# Patient Record
Sex: Female | Born: 1940 | Race: White | Hispanic: No | Marital: Married | Smoking: Former smoker
Health system: Southern US, Community
[De-identification: ages and names within clinical notes are randomized; demographics above are authoritative.]

## PROBLEM LIST (undated history)

## (undated) DIAGNOSIS — I499 Cardiac arrhythmia, unspecified: Secondary | ICD-10-CM

## (undated) DIAGNOSIS — K219 Gastro-esophageal reflux disease without esophagitis: Secondary | ICD-10-CM

## (undated) DIAGNOSIS — K589 Irritable bowel syndrome without diarrhea: Secondary | ICD-10-CM

## (undated) DIAGNOSIS — R112 Nausea with vomiting, unspecified: Secondary | ICD-10-CM

## (undated) DIAGNOSIS — F419 Anxiety disorder, unspecified: Secondary | ICD-10-CM

## (undated) DIAGNOSIS — G7000 Myasthenia gravis without (acute) exacerbation: Secondary | ICD-10-CM

## (undated) DIAGNOSIS — M1990 Unspecified osteoarthritis, unspecified site: Secondary | ICD-10-CM

## (undated) DIAGNOSIS — R197 Diarrhea, unspecified: Secondary | ICD-10-CM

## (undated) DIAGNOSIS — D649 Anemia, unspecified: Secondary | ICD-10-CM

## (undated) DIAGNOSIS — E039 Hypothyroidism, unspecified: Secondary | ICD-10-CM

## (undated) DIAGNOSIS — Z9889 Other specified postprocedural states: Secondary | ICD-10-CM

## (undated) HISTORY — PX: TONSILLECTOMY: SUR1361

## (undated) HISTORY — PX: OTHER SURGICAL HISTORY: SHX169

---

## 1961-12-11 HISTORY — PX: OTHER SURGICAL HISTORY: SHX169

## 1969-08-11 HISTORY — PX: ABDOMINAL HYSTERECTOMY: SHX81

## 1998-03-19 ENCOUNTER — Ambulatory Visit (HOSPITAL_BASED_OUTPATIENT_CLINIC_OR_DEPARTMENT_OTHER): Admission: RE | Admit: 1998-03-19 | Discharge: 1998-03-19 | Payer: Self-pay | Admitting: Orthopedic Surgery

## 1999-12-15 ENCOUNTER — Encounter: Admission: RE | Admit: 1999-12-15 | Discharge: 1999-12-15 | Payer: Self-pay | Admitting: Gastroenterology

## 1999-12-15 ENCOUNTER — Encounter: Payer: Self-pay | Admitting: Gastroenterology

## 2001-02-11 ENCOUNTER — Encounter: Admission: RE | Admit: 2001-02-11 | Discharge: 2001-02-11 | Payer: Self-pay | Admitting: Gastroenterology

## 2001-02-11 ENCOUNTER — Encounter: Payer: Self-pay | Admitting: Gastroenterology

## 2001-03-20 ENCOUNTER — Encounter: Admission: RE | Admit: 2001-03-20 | Discharge: 2001-03-20 | Payer: Self-pay | Admitting: Gastroenterology

## 2001-03-20 ENCOUNTER — Encounter: Payer: Self-pay | Admitting: Gastroenterology

## 2001-06-27 ENCOUNTER — Ambulatory Visit (HOSPITAL_BASED_OUTPATIENT_CLINIC_OR_DEPARTMENT_OTHER): Admission: RE | Admit: 2001-06-27 | Discharge: 2001-06-27 | Payer: Self-pay | Admitting: Orthopedic Surgery

## 2002-04-15 ENCOUNTER — Encounter: Admission: RE | Admit: 2002-04-15 | Discharge: 2002-04-15 | Payer: Self-pay | Admitting: Gastroenterology

## 2002-04-15 ENCOUNTER — Encounter: Payer: Self-pay | Admitting: Gastroenterology

## 2003-04-21 ENCOUNTER — Encounter: Admission: RE | Admit: 2003-04-21 | Discharge: 2003-04-21 | Payer: Self-pay | Admitting: Gastroenterology

## 2003-04-21 ENCOUNTER — Encounter: Payer: Self-pay | Admitting: Gastroenterology

## 2003-10-15 ENCOUNTER — Encounter: Admission: RE | Admit: 2003-10-15 | Discharge: 2003-10-15 | Payer: Self-pay | Admitting: Gastroenterology

## 2004-02-19 ENCOUNTER — Ambulatory Visit (HOSPITAL_COMMUNITY): Admission: RE | Admit: 2004-02-19 | Discharge: 2004-02-19 | Payer: Self-pay | Admitting: Gastroenterology

## 2004-05-20 ENCOUNTER — Encounter: Admission: RE | Admit: 2004-05-20 | Discharge: 2004-05-20 | Payer: Self-pay | Admitting: Gastroenterology

## 2005-05-31 ENCOUNTER — Encounter: Admission: RE | Admit: 2005-05-31 | Discharge: 2005-05-31 | Payer: Self-pay | Admitting: Gastroenterology

## 2006-06-01 ENCOUNTER — Encounter: Admission: RE | Admit: 2006-06-01 | Discharge: 2006-06-01 | Payer: Self-pay | Admitting: Gastroenterology

## 2006-06-14 ENCOUNTER — Encounter: Admission: RE | Admit: 2006-06-14 | Discharge: 2006-06-14 | Payer: Self-pay | Admitting: Gastroenterology

## 2007-06-04 ENCOUNTER — Encounter: Admission: RE | Admit: 2007-06-04 | Discharge: 2007-06-04 | Payer: Self-pay | Admitting: Gastroenterology

## 2008-06-04 ENCOUNTER — Encounter: Admission: RE | Admit: 2008-06-04 | Discharge: 2008-06-04 | Payer: Self-pay | Admitting: Gastroenterology

## 2009-06-15 ENCOUNTER — Encounter: Admission: RE | Admit: 2009-06-15 | Discharge: 2009-06-15 | Payer: Self-pay | Admitting: Gastroenterology

## 2009-11-30 ENCOUNTER — Other Ambulatory Visit: Admission: RE | Admit: 2009-11-30 | Discharge: 2009-11-30 | Payer: Self-pay | Admitting: Obstetrics and Gynecology

## 2009-12-28 ENCOUNTER — Ambulatory Visit (HOSPITAL_BASED_OUTPATIENT_CLINIC_OR_DEPARTMENT_OTHER): Admission: RE | Admit: 2009-12-28 | Discharge: 2009-12-28 | Payer: Self-pay | Admitting: Orthopedic Surgery

## 2010-03-01 ENCOUNTER — Encounter: Admission: RE | Admit: 2010-03-01 | Discharge: 2010-03-01 | Payer: Self-pay | Admitting: Internal Medicine

## 2010-07-05 ENCOUNTER — Encounter: Admission: RE | Admit: 2010-07-05 | Discharge: 2010-07-05 | Payer: Self-pay | Admitting: Internal Medicine

## 2011-02-26 LAB — BASIC METABOLIC PANEL
BUN: 10 mg/dL (ref 6–23)
CO2: 26 mEq/L (ref 19–32)
Calcium: 9.5 mg/dL (ref 8.4–10.5)
Chloride: 105 mEq/L (ref 96–112)
Creatinine, Ser: 0.79 mg/dL (ref 0.4–1.2)
GFR calc Af Amer: >60 mL/min (ref >60)
GFR calc non Af Amer: >60 mL/min (ref >60)
Glucose, Bld: 96 mg/dL (ref 70–99)
Potassium: 4.6 mEq/L (ref 3.5–5.1)
Sodium: 138 mEq/L (ref 135–145)

## 2011-02-26 LAB — POCT HEMOGLOBIN-HEMACUE: Hemoglobin: 12.5 g/dL (ref 12.0–15.0)

## 2011-04-28 NOTE — Op Note (Signed)
[facility]  Patient:    Jean Jennings, Jean Jennings                         MRN: [id number] Proc. Date: 06/27/01 Adm. Date:  [id number] Attending:  Ronne Binning                           Operative Report  PREOPERATIVE DIAGNOSIS:  Partial scapholunate ligament tear, triangular fibrocartilage complex tear - left wrist.  POSTOPERATIVE DIAGNOSIS:  Partial scapholunate ligament tear, triangular fibrocartilage complex tear - left wrist.  OPERATION:  Arthroscopy with debridement, capsular shrinkage of scapholunate TFCC tear - left wrist.  SURGEON:  Nicki Reaper, M.D.  ASSISTANT:  Joaquin Courts, R.N.  ANESTHESIA:  Axillary block.  DATE OF OPERATION:  June 27, 2001  ANESTHESIOLOGIST:  Janetta Hora. Gelene Mink, M.D.  HISTORY:  The patient is a 70 year old female with a history of wrist pain. She had an MRI with a collection of contrast on the ulnar aspect of her wrist and communication through the scapholunate ligament complex.  PROCEDURE:  The patient was brought to the operating room where an axillary block was carried out without difficulty.  She was prepped and draped using Betadine scrub and solution with the left arm free.  The limb was placed in the arthroscopy tower with 10 pounds of traction applied, the joint inflated to the 3/4 portal.  A transverse incision was made through skin only, deepened with the hemostat.  A blunt trocar was used to enter the joint.  The joint was inspected.  The volar radial wrist ligaments were intact.  The lunar triquetral ligament was intact.  The scapholunate showed a tear in the most proximal aspect.  The cartilage showed no degenerative changes on the scaphoid lunate triquetrum, distal radius.  The triangular fibrocartilage showed some fibrillation.  A small partial tear at the margin of the prestyloid recess was identified.  An irrigation catheter was placed in 6U.  A 4/5 portal opened after localization with a 22  gauge needle.  The joint was then inspected from the ulnar side 4/5 portal after creating this similar to the 3/4.  Lunar triquetral ligament was intact.  Capsular shrinkage to the TFCC dorsal attachment and debridement of the synovitis on the ulnar side was then performed, stabilizing the TFCC with a normal trampoline.  The scope was transferred from 3/4 to 4/5 over adjustment of the arthroscopy tower.  The scapholunate ligament was probed and found to be a partial tear.  The dorsal portion was intact.  The mid carpal joint was then inspected.  A probe was not able to be passed through the scapholunate and there was no step-off.  The remainder of the joint showed no cartilaginous damage on triquetrum hamate capitate or STT joint.  The partial scapholunate ligament was then debrided with a shaver.  A shrinkage to the dorsal component of the scapholunate ligament complex was then performed.  The mid carpal joint was then reinspected and excellent stability was afforded.  The instruments were removed, the portals closed with interrupted 5-0 nylon sutures, sterile compressive dressing, dorsal palmar and thumb spica splint applied.  The patient tolerated the procedure well and was taken to the recovery room for observation in satisfactory condition.  DISPOSITION:  She is discharged home to return to [facility] of [place] in one week on Talwin NX and Keflex. DD:  06/27/01 TD:  06/27/01 Job: [id number]

## 2011-07-17 ENCOUNTER — Other Ambulatory Visit: Payer: Self-pay | Admitting: Internal Medicine

## 2011-07-17 DIAGNOSIS — Z1231 Encounter for screening mammogram for malignant neoplasm of breast: Secondary | ICD-10-CM

## 2011-08-02 ENCOUNTER — Ambulatory Visit
Admission: RE | Admit: 2011-08-02 | Discharge: 2011-08-02 | Disposition: A | Discharge status: Home / Self Care | Payer: Federal, State, Local not specified - PPO | Source: Ambulatory Visit | Attending: Internal Medicine | Admitting: Internal Medicine

## 2011-08-02 DIAGNOSIS — Z1231 Encounter for screening mammogram for malignant neoplasm of breast: Secondary | ICD-10-CM

## 2011-09-11 HISTORY — PX: OTHER SURGICAL HISTORY: SHX169

## 2011-09-22 ENCOUNTER — Ambulatory Visit (HOSPITAL_BASED_OUTPATIENT_CLINIC_OR_DEPARTMENT_OTHER)
Admission: RE | Admit: 2011-09-22 | Discharge: 2011-09-22 | Disposition: A | Discharge status: Home / Self Care | Payer: Federal, State, Local not specified - PPO | Source: Ambulatory Visit | Attending: Orthopedic Surgery | Admitting: Orthopedic Surgery

## 2011-09-22 DIAGNOSIS — E039 Hypothyroidism, unspecified: Secondary | ICD-10-CM | POA: Insufficient documentation

## 2011-09-22 DIAGNOSIS — I498 Other specified cardiac arrhythmias: Secondary | ICD-10-CM | POA: Insufficient documentation

## 2011-09-22 DIAGNOSIS — M1710 Unilateral primary osteoarthritis, unspecified knee: Secondary | ICD-10-CM | POA: Insufficient documentation

## 2011-09-22 DIAGNOSIS — I4949 Other premature depolarization: Secondary | ICD-10-CM | POA: Insufficient documentation

## 2011-09-22 DIAGNOSIS — IMO0002 Reserved for concepts with insufficient information to code with codable children: Secondary | ICD-10-CM | POA: Insufficient documentation

## 2011-09-22 DIAGNOSIS — Z79899 Other long term (current) drug therapy: Secondary | ICD-10-CM | POA: Insufficient documentation

## 2011-09-22 DIAGNOSIS — X58XXXA Exposure to other specified factors, initial encounter: Secondary | ICD-10-CM | POA: Insufficient documentation

## 2011-09-22 LAB — POCT HEMOGLOBIN-HEMACUE: Hemoglobin: 12.9 g/dL (ref 12.0–15.0)

## 2011-10-04 NOTE — Op Note (Signed)
NAMECRISTINE, Jean Jennings                  ACCOUNT NO.:  [id number]  MEDICAL RECORD NO.:  [id number]  LOCATION:                               FACILITY:  [facility]  PHYSICIAN:  Ollen Gross, M.D.    DATE OF BIRTH:  [birth year]  DATE OF PROCEDURE:  09/22/2011 DATE OF DISCHARGE:                              OPERATIVE REPORT   PREOPERATIVE DIAGNOSES: 1. Left knee medial meniscal tear. 2. Right knee arthritis.  POSTOPERATIVE DIAGNOSES: 1. Left knee medial meniscal tear. 2. Right knee arthritis.  PROCEDURE: 1. Left knee arthroscopy with medial meniscal debridement. 2. Right knee cortisone injection.  SURGEON:  Ollen Gross, M.D.  ASSISTANT:  No assistant.  ANESTHESIA:  General.  ESTIMATED BLOOD LOSS:  Minimal.  DRAINS:  None.  COMPLICATION:  None.  CONDITION:  Stable to recovery.  CLINICAL NOTE:  Ms. Jean Jennings is a 70 year old female who has a several- month history of significant left knee pain and mechanical symptoms. Exam and history suggested a medial meniscal tear, confirmed by MRI.  In addition in the past few weeks, she has had pain and swelling in her right knee.  Symptoms at the right knee are not mechanical, but are more inflammatory.  In order to help improve her rehab experience, we decided to do cortisone injection in the right knee today.  PROCEDURE IN DETAIL:  After successful administration of general anesthetic, a tourniquet was placed high on the left thigh and left lower extremity was prepped and draped in the usual sterile fashion. Standard superomedial and inferolateral incisions were made, inflow cannula passed, superomedial camera passed inferolateral.  Arthroscopic visualization proceeds.  The undersurface of the patella and trochlea showed some chondromalacia, but no full-thickness chondral defects.  The medial and lateral gutters were visualized.  There are couple of small loose cartilage fragments and those were flushed out with the arthroscopic pump.   The flexion valgus force applied to the knee and the medial compartment was entered.  There was evidence of a tear in the body and the posterior horn of the medial meniscus.  It was an unstable tear.  There was also some grade 2 chondromalacia on the medial femoral condyle, but no full-thickness chondral defects.  Spinal needle was used to localize the inferomedial portal, small incision made and dilator placed.  The meniscus was debrided back to stable base with baskets and a 4.2 mm shaver and then sealed off with the ArthroCare device.  The medial femoral condyle was probed and there was no unstable cartilage in the areas where the chondromalacia was present.  The intercondylar notch was visualized.  The ACL was normal.  Lateral compartment was entered, it looks normal.  Joints were again inspected.  No other tears, defects, or loose bodies noted.  There was a plica present over the medial side superiorly and I excised that with the ArthroCare.  The arthroscopic equipment was then removed from the inferior portals, which were closed with interrupted 4-0 nylon.  20 mL of 0.25% Marcaine with epi injected through the inflow cannula, and that was removed and that portal closed with nylon.  Incisions cleaned and dried and a bulky sterile dressing applied.  I  then prepped the right knee and injected with combination 5 mL 1% lidocaine, 4 mg of Decadron.  She was subsequently awakened and transported to recovery in stable condition.     Ollen Gross, M.D.     FA/MEDQ  D:  09/22/2011  T:  09/22/2011  Job:  [id number]  Electronically Signed by Ollen Gross M.D. on 10/04/2011 11:25:20 AM

## 2012-05-22 ENCOUNTER — Other Ambulatory Visit: Payer: Self-pay | Admitting: Dermatology

## 2012-08-13 ENCOUNTER — Other Ambulatory Visit: Payer: Self-pay | Admitting: Internal Medicine

## 2012-08-13 DIAGNOSIS — Z1231 Encounter for screening mammogram for malignant neoplasm of breast: Secondary | ICD-10-CM

## 2012-08-27 ENCOUNTER — Ambulatory Visit: Payer: Federal, State, Local not specified - PPO

## 2012-08-29 ENCOUNTER — Ambulatory Visit
Admission: RE | Admit: 2012-08-29 | Discharge: 2012-08-29 | Disposition: A | Discharge status: Home / Self Care | Payer: Federal, State, Local not specified - PPO | Source: Ambulatory Visit | Attending: Internal Medicine | Admitting: Internal Medicine

## 2012-08-29 DIAGNOSIS — Z1231 Encounter for screening mammogram for malignant neoplasm of breast: Secondary | ICD-10-CM

## 2013-05-07 ENCOUNTER — Encounter (HOSPITAL_COMMUNITY): Payer: Self-pay | Admitting: *Deleted

## 2013-05-07 ENCOUNTER — Other Ambulatory Visit: Payer: Self-pay | Admitting: Gastroenterology

## 2013-05-12 ENCOUNTER — Encounter (HOSPITAL_COMMUNITY): Payer: Self-pay | Admitting: Pharmacy Technician

## 2013-05-20 ENCOUNTER — Ambulatory Visit (HOSPITAL_COMMUNITY)
Admission: RE | Admit: 2013-05-20 | Payer: Federal, State, Local not specified - PPO | Source: Ambulatory Visit | Admitting: Gastroenterology

## 2013-05-20 HISTORY — DX: Diarrhea, unspecified: R19.7

## 2013-05-20 HISTORY — DX: Cardiac arrhythmia, unspecified: I49.9

## 2013-05-20 HISTORY — DX: Hypothyroidism, unspecified: E03.9

## 2013-05-20 HISTORY — DX: Gastro-esophageal reflux disease without esophagitis: K21.9

## 2013-05-20 SURGERY — COLONOSCOPY WITH PROPOFOL
Anesthesia: Monitor Anesthesia Care

## 2013-05-22 ENCOUNTER — Other Ambulatory Visit: Payer: Self-pay | Admitting: Gastroenterology

## 2013-05-23 ENCOUNTER — Encounter (HOSPITAL_COMMUNITY): Payer: Self-pay | Admitting: Pharmacy Technician

## 2013-05-23 ENCOUNTER — Encounter (HOSPITAL_COMMUNITY): Payer: Self-pay | Admitting: *Deleted

## 2013-05-26 ENCOUNTER — Other Ambulatory Visit: Payer: Self-pay | Admitting: Dermatology

## 2013-06-03 ENCOUNTER — Encounter (HOSPITAL_COMMUNITY): Payer: Self-pay

## 2013-06-03 ENCOUNTER — Ambulatory Visit (HOSPITAL_COMMUNITY): Payer: Federal, State, Local not specified - PPO | Admitting: Anesthesiology

## 2013-06-03 ENCOUNTER — Encounter (HOSPITAL_COMMUNITY)
Admission: RE | Disposition: A | Discharge status: Home / Self Care | Payer: Self-pay | Source: Ambulatory Visit | Attending: Gastroenterology

## 2013-06-03 ENCOUNTER — Encounter (HOSPITAL_COMMUNITY): Payer: Self-pay | Admitting: Anesthesiology

## 2013-06-03 ENCOUNTER — Ambulatory Visit (HOSPITAL_COMMUNITY)
Admission: RE | Admit: 2013-06-03 | Discharge: 2013-06-03 | Disposition: A | Discharge status: Home / Self Care | Payer: Federal, State, Local not specified - PPO | Source: Ambulatory Visit | Attending: Gastroenterology | Admitting: Gastroenterology

## 2013-06-03 DIAGNOSIS — K5730 Diverticulosis of large intestine without perforation or abscess without bleeding: Secondary | ICD-10-CM | POA: Insufficient documentation

## 2013-06-03 DIAGNOSIS — E039 Hypothyroidism, unspecified: Secondary | ICD-10-CM | POA: Insufficient documentation

## 2013-06-03 DIAGNOSIS — R197 Diarrhea, unspecified: Secondary | ICD-10-CM | POA: Insufficient documentation

## 2013-06-03 HISTORY — PX: COLONOSCOPY WITH PROPOFOL: SHX5780

## 2013-06-03 SURGERY — COLONOSCOPY WITH PROPOFOL
Anesthesia: Monitor Anesthesia Care

## 2013-06-03 MED ORDER — PROMETHAZINE HCL 25 MG/ML IJ SOLN: 6.2500 mg | INTRAMUSCULAR | Status: DC | PRN

## 2013-06-03 MED ORDER — SODIUM CHLORIDE 0.9 % IV SOLN: INTRAVENOUS | Status: DC

## 2013-06-03 MED ORDER — LACTATED RINGERS IV SOLN
INTRAVENOUS | Status: DC
  Administered 2013-06-03: 1000 mL via INTRAVENOUS

## 2013-06-03 MED ORDER — LACTATED RINGERS IV SOLN
INTRAVENOUS | Status: DC | PRN
  Administered 2013-06-03: 11:00:00 via INTRAVENOUS

## 2013-06-03 MED ORDER — KETAMINE HCL 50 MG/ML IJ SOLN
INTRAMUSCULAR | Status: DC | PRN
  Administered 2013-06-03: 15 mg via INTRAMUSCULAR
  Administered 2013-06-03: 10 mg via INTRAMUSCULAR

## 2013-06-03 MED ORDER — PROPOFOL INFUSION 10 MG/ML OPTIME
INTRAVENOUS | Status: DC | PRN
  Administered 2013-06-03: 75 ug/kg/min via INTRAVENOUS

## 2013-06-03 MED ORDER — LIDOCAINE HCL (CARDIAC) 20 MG/ML IV SOLN
INTRAVENOUS | Status: DC | PRN
  Administered 2013-06-03: 50 mg via INTRAVENOUS

## 2013-06-03 SURGICAL SUPPLY — 21 items

## 2013-06-03 NOTE — Op Note (Signed)
Problem: Unexplained chronic diarrhea  Procedure: Diagnostic colonoscopy  Endoscopist: Danise Edge  Premedication: Propofol administered by anesthesia  Procedure: The patient was placed in the left lateral decubitus position. Anal inspection and digital rectal exam were normal. The Pentax pediatric colonoscope was introduced into the rectum and advanced to the cecum. A normal-appearing ileocecal valve and appendiceal orifice were identified. Colonic preparation for the exam today was good.  Rectum. Normal. Retroflexed view of the distal rectum normal.  Sigmoid colon and descending colon. Left colonic diverticulosis.  Splenic flexure. Normal.  Transverse colon. Normal.  Hepatic flexure. Normal.  Ascending colon. Normal.  Cecum and ileocecal valve. Normal.  Biopsies: Random colon biopsies were performed from the right colon and left colon to look for microscopic colitis.  Assessment: Normal screening proctocolonoscopy to the cecum except for left colonic diverticulosis. Random colon biopsies to rule out microscopic colitis pending.

## 2013-06-03 NOTE — Transfer of Care (Signed)
Immediate Anesthesia Transfer of Care Note  Patient: Jean Jennings  Procedure(s) Performed: Procedure(s) (LRB): COLONOSCOPY WITH PROPOFOL (N/A)  Patient Location: PACU  Anesthesia Type: MAC  Level of Consciousness: sedated, patient cooperative and responds to stimulaton  Airway & Oxygen Therapy: Patient Spontanous Breathing and Patient connected to face mask oxgen  Post-op Assessment: Report given to PACU RN and Post -op Vital signs reviewed and stable  Post vital signs: Reviewed and stable  Complications: No apparent anesthesia complications

## 2013-06-03 NOTE — Anesthesia Postprocedure Evaluation (Signed)
  Anesthesia Post-op Note  Patient: Jean Jennings  Procedure(s) Performed: Procedure(s) (LRB): COLONOSCOPY WITH PROPOFOL (N/A)  Patient Location: PACU  Anesthesia Type: MAC  Level of Consciousness: awake and alert   Airway and Oxygen Therapy: Patient Spontanous Breathing  Post-op Pain: mild  Post-op Assessment: Post-op Vital signs reviewed, Patient's Cardiovascular Status Stable, Respiratory Function Stable, Patent Airway and No signs of Nausea or vomiting  Last Vitals:  Filed Vitals:   06/03/13 1133  BP: 130/56  Temp: 36.5 C  Resp: 14    Post-op Vital Signs: stable   Complications: No apparent anesthesia complications

## 2013-06-03 NOTE — Anesthesia Preprocedure Evaluation (Signed)
Anesthesia Evaluation  Patient identified by MRN, date of birth, ID band Patient awake    Reviewed: Allergy & Precautions, H&P , NPO status , Patient's Chart, lab work & pertinent test results  Airway Mallampati: II TM Distance: >3 FB Neck ROM: Full    Dental no notable dental hx.    Pulmonary neg pulmonary ROS,  breath sounds clear to auscultation  Pulmonary exam normal       Cardiovascular negative cardio ROS  + dysrhythmias Supra Ventricular Tachycardia Rhythm:Regular Rate:Normal     Neuro/Psych negative neurological ROS  negative psych ROS   GI/Hepatic negative GI ROS, Neg liver ROS,   Endo/Other  Hypothyroidism   Renal/GU negative Renal ROS  negative genitourinary   Musculoskeletal negative musculoskeletal ROS (+)   Abdominal   Peds negative pediatric ROS (+)  Hematology negative hematology ROS (+)   Anesthesia Other Findings   Reproductive/Obstetrics negative OB ROS                           Anesthesia Physical Anesthesia Plan  ASA: II  Anesthesia Plan: MAC   Post-op Pain Management:    Induction: Intravenous  Airway Management Planned: Nasal Cannula  Additional Equipment:   Intra-op Plan:   Post-operative Plan:   Informed Consent: I have reviewed the patients History and Physical, chart, labs and discussed the procedure including the risks, benefits and alternatives for the proposed anesthesia with the patient or authorized representative who has indicated his/her understanding and acceptance.     Plan Discussed with: CRNA and Surgeon  Anesthesia Plan Comments:         Anesthesia Quick Evaluation

## 2013-06-03 NOTE — H&P (Signed)
  Problem: Chronic diarrhea. Normal screening colonoscopy on 02/19/2004  History: The patient is a 72 year old female born 07/03/1941. The patient underwent a normal screening colonoscopy on 02/19/2004.  Patient has a remote history of diarrhea predominant irritable bowel syndrome.  The patient has developed more frequent bouts of nonbloody diarrhea unassociated with fever, weight loss, gastrointestinal bleeding. The patient denies a personal or family history of celiac disease. She consumes soy milk. There is no past history of inflammatory bowel disease.  Differential diagnosis includes the following: Diarrhea predominant irritable bowel syndrome, inflammatory bowel disease including microscopic colitis, and carbohydrate intolerance. Tissue transglutaminase antibody was negative as a screen for celiac disease.  Patient is scheduled to undergo a diagnostic colonoscopy with performance of random colon biopsies to rule out microscopic colitis.  Chronic medications: Metoprolol. Premarin. Synthroid. Citalopram.  Past medical and surgical history: Depression. Urinary urgency. Irritable bowel syndrome. Primary hypothyroidism. Palpitations due to unifocal PVCs. Resting tremor. Gastroesophageal reflux. Bilateral hearing aids. Hypercholesterolemia. Tonsillectomy. Appendectomy. Hysterectomy with bilateral salpingo-oophorectomy. History of endometriosis. Thyroidectomy. Knee arthroscopy.  Medication allergies: Codeine. Fosamax. Celebrex.  Exam: The patient is alert and lying comfortably on the endoscopy stretcher. Abdomen is soft, flat, and nontender to palpation. Cardiac exam reveals a regular rhythm. Lungs are clear to auscultation  Plan: Proceed with diagnostic colonoscopy with random colon biopsies to rule out microscopic colitis.

## 2013-06-04 ENCOUNTER — Encounter (HOSPITAL_COMMUNITY): Payer: Self-pay | Admitting: Gastroenterology

## 2013-10-01 ENCOUNTER — Other Ambulatory Visit: Payer: Self-pay

## 2013-10-01 DIAGNOSIS — Z1231 Encounter for screening mammogram for malignant neoplasm of breast: Secondary | ICD-10-CM

## 2013-10-21 ENCOUNTER — Ambulatory Visit
Admission: RE | Admit: 2013-10-21 | Discharge: 2013-10-21 | Disposition: A | Discharge status: Home / Self Care | Payer: Federal, State, Local not specified - PPO | Source: Ambulatory Visit

## 2013-10-21 DIAGNOSIS — Z1231 Encounter for screening mammogram for malignant neoplasm of breast: Secondary | ICD-10-CM

## 2013-10-23 ENCOUNTER — Other Ambulatory Visit: Payer: Self-pay | Admitting: Internal Medicine

## 2013-10-23 DIAGNOSIS — R928 Other abnormal and inconclusive findings on diagnostic imaging of breast: Secondary | ICD-10-CM

## 2013-11-04 ENCOUNTER — Ambulatory Visit
Admission: RE | Admit: 2013-11-04 | Discharge: 2013-11-04 | Disposition: A | Discharge status: Home / Self Care | Payer: Federal, State, Local not specified - PPO | Source: Ambulatory Visit | Attending: Internal Medicine | Admitting: Internal Medicine

## 2013-11-04 DIAGNOSIS — R928 Other abnormal and inconclusive findings on diagnostic imaging of breast: Secondary | ICD-10-CM

## 2014-12-22 ENCOUNTER — Other Ambulatory Visit: Payer: Self-pay

## 2014-12-22 DIAGNOSIS — Z1231 Encounter for screening mammogram for malignant neoplasm of breast: Secondary | ICD-10-CM

## 2014-12-29 ENCOUNTER — Ambulatory Visit
Admission: RE | Admit: 2014-12-29 | Discharge: 2014-12-29 | Disposition: A | Discharge status: Home / Self Care | Payer: Federal, State, Local not specified - PPO | Source: Ambulatory Visit

## 2014-12-29 DIAGNOSIS — Z1231 Encounter for screening mammogram for malignant neoplasm of breast: Secondary | ICD-10-CM

## 2014-12-30 ENCOUNTER — Other Ambulatory Visit: Payer: Self-pay | Admitting: Internal Medicine

## 2014-12-30 DIAGNOSIS — R928 Other abnormal and inconclusive findings on diagnostic imaging of breast: Secondary | ICD-10-CM

## 2015-01-07 ENCOUNTER — Other Ambulatory Visit: Payer: Federal, State, Local not specified - PPO

## 2015-01-13 ENCOUNTER — Other Ambulatory Visit: Payer: Self-pay | Admitting: Internal Medicine

## 2015-01-13 ENCOUNTER — Ambulatory Visit
Admission: RE | Admit: 2015-01-13 | Discharge: 2015-01-13 | Disposition: A | Discharge status: Home / Self Care | Payer: Federal, State, Local not specified - PPO | Source: Ambulatory Visit | Attending: Internal Medicine | Admitting: Internal Medicine

## 2015-01-13 DIAGNOSIS — R928 Other abnormal and inconclusive findings on diagnostic imaging of breast: Secondary | ICD-10-CM

## 2015-01-27 ENCOUNTER — Other Ambulatory Visit: Payer: Self-pay | Admitting: Internal Medicine

## 2015-01-27 DIAGNOSIS — R1013 Epigastric pain: Secondary | ICD-10-CM

## 2015-01-29 ENCOUNTER — Ambulatory Visit
Admission: RE | Admit: 2015-01-29 | Discharge: 2015-01-29 | Disposition: A | Discharge status: Home / Self Care | Payer: Federal, State, Local not specified - PPO | Source: Ambulatory Visit | Attending: Internal Medicine | Admitting: Internal Medicine

## 2015-01-29 DIAGNOSIS — R1013 Epigastric pain: Secondary | ICD-10-CM

## 2015-06-02 ENCOUNTER — Emergency Department (HOSPITAL_COMMUNITY): Payer: Federal, State, Local not specified - PPO

## 2015-06-02 ENCOUNTER — Emergency Department (HOSPITAL_COMMUNITY)
Admission: EM | Admit: 2015-06-02 | Discharge: 2015-06-02 | Disposition: A | Discharge status: Home / Self Care | Payer: Federal, State, Local not specified - PPO | Attending: Emergency Medicine | Admitting: Emergency Medicine

## 2015-06-02 ENCOUNTER — Encounter (HOSPITAL_COMMUNITY): Payer: Self-pay | Admitting: Emergency Medicine

## 2015-06-02 DIAGNOSIS — Z79899 Other long term (current) drug therapy: Secondary | ICD-10-CM | POA: Insufficient documentation

## 2015-06-02 DIAGNOSIS — E039 Hypothyroidism, unspecified: Secondary | ICD-10-CM | POA: Diagnosis not present

## 2015-06-02 DIAGNOSIS — F329 Major depressive disorder, single episode, unspecified: Secondary | ICD-10-CM | POA: Insufficient documentation

## 2015-06-02 DIAGNOSIS — Z8719 Personal history of other diseases of the digestive system: Secondary | ICD-10-CM | POA: Diagnosis not present

## 2015-06-02 DIAGNOSIS — M549 Dorsalgia, unspecified: Secondary | ICD-10-CM

## 2015-06-02 DIAGNOSIS — M5416 Radiculopathy, lumbar region: Secondary | ICD-10-CM | POA: Diagnosis not present

## 2015-06-02 DIAGNOSIS — Z87891 Personal history of nicotine dependence: Secondary | ICD-10-CM | POA: Diagnosis not present

## 2015-06-02 DIAGNOSIS — R110 Nausea: Secondary | ICD-10-CM | POA: Insufficient documentation

## 2015-06-02 DIAGNOSIS — R109 Unspecified abdominal pain: Secondary | ICD-10-CM | POA: Diagnosis present

## 2015-06-02 DIAGNOSIS — Z8679 Personal history of other diseases of the circulatory system: Secondary | ICD-10-CM | POA: Insufficient documentation

## 2015-06-02 HISTORY — DX: Irritable bowel syndrome, unspecified: K58.9

## 2015-06-02 LAB — COMPREHENSIVE METABOLIC PANEL
ALT: 12 U/L — ABNORMAL LOW (ref 14–54)
AST: 18 U/L (ref 15–41)
Albumin: 4.5 g/dL (ref 3.5–5.0)
Alkaline Phosphatase: 49 U/L (ref 38–126)
Anion gap: 9 (ref 5–15)
BUN: 21 mg/dL — ABNORMAL HIGH (ref 6–20)
CO2: 20 mmol/L — ABNORMAL LOW (ref 22–32)
Calcium: 8.8 mg/dL — ABNORMAL LOW (ref 8.9–10.3)
Chloride: 107 mmol/L (ref 101–111)
Creatinine, Ser: 0.82 mg/dL (ref 0.44–1.00)
GFR calc Af Amer: >60 mL/min (ref >60)
GFR calc non Af Amer: >60 mL/min (ref >60)
Glucose, Bld: 106 mg/dL — ABNORMAL HIGH (ref 65–99)
Potassium: 3.6 mmol/L (ref 3.5–5.1)
Sodium: 136 mmol/L (ref 135–145)
Total Bilirubin: 0.6 mg/dL (ref 0.3–1.2)
Total Protein: 7.4 g/dL (ref 6.5–8.1)

## 2015-06-02 LAB — URINALYSIS, ROUTINE W REFLEX MICROSCOPIC
Bilirubin Urine: NEGATIVE
Glucose, UA: NEGATIVE mg/dL
Hgb urine dipstick: NEGATIVE
Ketones, ur: 15 mg/dL — AB
Leukocytes, UA: NEGATIVE
Nitrite: NEGATIVE
Protein, ur: NEGATIVE mg/dL
Specific Gravity, Urine: 1.008 (ref 1.005–1.030)
Urobilinogen, UA: 0.2 mg/dL (ref 0.0–1.0)
pH: 7.5 (ref 5.0–8.0)

## 2015-06-02 LAB — CBC
HCT: 35.4 % — ABNORMAL LOW (ref 36.0–46.0)
Hemoglobin: 11.8 g/dL — ABNORMAL LOW (ref 12.0–15.0)
MCH: 29.6 pg (ref 26.0–34.0)
MCHC: 33.3 g/dL (ref 30.0–36.0)
MCV: 88.7 fL (ref 78.0–100.0)
Platelets: 198 10*3/uL (ref 150–400)
RBC: 3.99 MIL/uL (ref 3.87–5.11)
RDW: 12.5 % (ref 11.5–15.5)
WBC: 9 10*3/uL (ref 4.0–10.5)

## 2015-06-02 MED ORDER — SODIUM CHLORIDE 0.9 % IV SOLN
Freq: Once | INTRAVENOUS | Status: AC
  Administered 2015-06-02: 16:00:00 via INTRAVENOUS

## 2015-06-02 MED ORDER — FENTANYL CITRATE (PF) 100 MCG/2ML IJ SOLN
50.0000 ug | Freq: Once | INTRAMUSCULAR | Status: AC
  Administered 2015-06-02: 50 ug via INTRAVENOUS
  Filled 2015-06-02: qty 2

## 2015-06-02 MED ORDER — ONDANSETRON HCL 4 MG/2ML IJ SOLN: 4.0000 mg | Freq: Once | INTRAMUSCULAR | Status: DC

## 2015-06-02 MED ORDER — SODIUM CHLORIDE 0.9 % IV BOLUS (SEPSIS)
500.0000 mL | Freq: Once | INTRAVENOUS | Status: AC
  Administered 2015-06-02: 500 mL via INTRAVENOUS

## 2015-06-02 MED ORDER — DEXAMETHASONE SODIUM PHOSPHATE 10 MG/ML IJ SOLN
10.0000 mg | Freq: Once | INTRAMUSCULAR | Status: DC
  Filled 2015-06-02: qty 1

## 2015-06-02 MED ORDER — HYDROMORPHONE HCL 1 MG/ML IJ SOLN
0.5000 mg | INTRAMUSCULAR | Status: DC | PRN
  Administered 2015-06-02 (×3): 0.5 mg via INTRAVENOUS
  Filled 2015-06-02 (×3): qty 1

## 2015-06-02 MED ORDER — ONDANSETRON 4 MG PO TBDP: 4.0000 mg | ORAL_TABLET | Freq: Three times a day (TID) | ORAL | Status: DC | PRN

## 2015-06-02 MED ORDER — ONDANSETRON HCL 4 MG/2ML IJ SOLN
4.0000 mg | Freq: Once | INTRAMUSCULAR | Status: AC
  Administered 2015-06-02: 4 mg via INTRAVENOUS
  Filled 2015-06-02: qty 2

## 2015-06-02 MED ORDER — FENTANYL CITRATE (PF) 100 MCG/2ML IJ SOLN: 100.0000 ug | Freq: Once | INTRAMUSCULAR | Status: DC

## 2015-06-02 MED ORDER — METHYLPREDNISOLONE 4 MG PO TBPK: ORAL_TABLET | ORAL | Status: DC

## 2015-06-02 MED ORDER — OXYCODONE-ACETAMINOPHEN 5-325 MG PO TABS: 2.0000 | ORAL_TABLET | ORAL | Status: DC | PRN

## 2015-06-02 NOTE — ED Notes (Signed)
I will get urine when patient can urinate again

## 2015-06-02 NOTE — ED Notes (Signed)
Jean Jennings [phone]..... Cell phone Call him if anything happens..... He will be back in a hour from now

## 2015-06-02 NOTE — ED Provider Notes (Signed)
CSN: [id number]     Arrival date & time 06/02/15  1357 History   First MD Initiated Contact with Patient 06/02/15 1438     Chief Complaint  Patient presents with  . Flank Pain      HPI  Syringe evaluation of left flank pain. She awakened during the night a few times with some mild pain in her left low back. Became worse over the course today and is now severe and radiating to her left lower flank, and hip. . States it feels "sharp like a vice". It does somewhat come and go. Associated mild nausea which she states is secondary to the pain, but no vomiting. States she's never had pain similar to this extent in the past. No history of kidney stones. Has a brother with kidney stones. No known diverticulitis or diverticula. No symptoms over the last few days rather sudden onset during the course of the night.  No change in bowel or bladder habits. No dysuria or hematuria. No rash or vesicles. Her pain is minimally worse with ambulation. No weakness of the leg. No chest pain or shortness of breath.  Past Medical History  Diagnosis Date  . Dysrhythmia 3-4 yrs ago    hx irregular heart beat, dr Earl Gala manages  . Hypothyroidism   . Depression   . GERD (gastroesophageal reflux disease)   . Diarrhea last 3 to 4 months    stomach pain  . IBS (irritable bowel syndrome)    Past Surgical History  Procedure Laterality Date  . Abdominal hysterectomy  1970's  . Thryoid surgery  1963    partial  . Left wriist ligament surgery  sev yrs ago  . Arthroscopic surgery  oct 2012    left knee  . Colonoscopy with propofol N/A 06/03/2013    Procedure: COLONOSCOPY WITH PROPOFOL;  Surgeon: Charolett Bumpers, MD;  Location: [facility];  Service: Endoscopy;  Laterality: N/A;   History reviewed. No pertinent family history. History  Substance Use Topics  . Smoking status: Former Smoker -- 0.25 packs/day for 3 years    Types: Cigarettes    Quit date: 12/11/1968  . Smokeless tobacco: Never Used  . Alcohol  Use: Yes     Comment: occasional   OB History    No data available     Review of Systems  Constitutional: Negative for fever, chills, diaphoresis, appetite change and fatigue.  HENT: Negative for mouth sores, sore throat and trouble swallowing.   Eyes: Negative for visual disturbance.  Respiratory: Negative for cough, chest tightness, shortness of breath and wheezing.   Cardiovascular: Negative for chest pain.  Gastrointestinal: Positive for nausea. Negative for vomiting, abdominal pain, diarrhea and abdominal distention.  Endocrine: Negative for polydipsia, polyphagia and polyuria.  Genitourinary: Positive for flank pain. Negative for dysuria, frequency and hematuria.  Musculoskeletal: Negative for gait problem.  Skin: Negative for color change, pallor and rash.  Neurological: Negative for dizziness, syncope, light-headedness and headaches.  Hematological: Does not bruise/bleed easily.  Psychiatric/Behavioral: Negative for behavioral problems and confusion.      Allergies  Codeine  Home Medications   Prior to Admission medications   Medication Sig Start Date End Date Taking? Authorizing Provider  acetaminophen (TYLENOL) 500 MG tablet Take 500-1,000 mg by mouth every 6 (six) hours as needed for headache (headache).    Yes Historical Provider, MD  Cholecalciferol (VITAMIN D) 2000 UNITS CAPS Take 1 capsule by mouth every evening.    Yes Historical Provider, MD  citalopram (CELEXA) 10  MG tablet Take 5-10 mg by mouth every morning. Takes 5 mg one day and 10 mg  Next day alternates dosages   Yes Historical Provider, MD  estrogens, conjugated, (PREMARIN) 0.45 MG tablet Take 0.45 mg by mouth daily.    Yes Historical Provider, MD  levothyroxine (SYNTHROID, LEVOTHROID) 112 MCG tablet Take 112 mcg by mouth daily before breakfast.   Yes Historical Provider, MD  metoprolol (LOPRESSOR) 50 MG tablet Take 50 mg by mouth at bedtime.   Yes Historical Provider, MD  naproxen sodium (ANAPROX) 220  MG tablet Take 440 mg by mouth 2 (two) times daily as needed (back pain).   Yes Historical Provider, MD  methylPREDNISolone (MEDROL DOSEPAK) 4 MG TBPK tablet 6 by mouth on day 1. Decrease by 1 pill per day. SplintsDisp:  #21 tabs 06/02/15   Rolland Porter, MD  ondansetron (ZOFRAN ODT) 4 MG disintegrating tablet Take 1 tablet (4 mg total) by mouth every 8 (eight) hours as needed for nausea. 06/02/15   Rolland Porter, MD  oxyCODONE-acetaminophen (PERCOCET/ROXICET) 5-325 MG per tablet Take 2 tablets by mouth every 4 (four) hours as needed. 06/02/15   Rolland Porter, MD   BP 150/55 mmHg  Pulse 79  Temp(Src) 97.8 F (36.6 C) (Oral)  Resp 16  SpO2 93% Physical Exam  Constitutional: She is oriented to person, place, and time. She appears well-developed and well-nourished. She appears distressed.  Awake alert. Per younger than her stated age. Appears quite distressed due to pain.  HENT:  Head: Normocephalic.  Eyes: Conjunctivae are normal. Pupils are equal, round, and reactive to light. No scleral icterus.  Neck: Normal range of motion. Neck supple. No thyromegaly present.  Cardiovascular: Normal rate and regular rhythm.  Exam reveals no gallop and no friction rub.   No murmur heard. Pulmonary/Chest: Effort normal and breath sounds normal. No respiratory distress. She has no wheezes. She has no rales.  Abdominal: Soft. Bowel sounds are normal. She exhibits no distension. There is no tenderness. There is no rebound.    Musculoskeletal: Normal range of motion.       Back:  Neurological: She is alert and oriented to person, place, and time.  Skin: Skin is warm and dry. No rash noted.  Psychiatric: She has a normal mood and affect. Her behavior is normal.    ED Course  Procedures (including critical care time) Labs Review Labs Reviewed  URINALYSIS, ROUTINE W REFLEX MICROSCOPIC (NOT AT [facility]) - Abnormal; Notable for the following:    Ketones, ur 15 (*)    All other components within normal limits  CBC -  Abnormal; Notable for the following:    Hemoglobin 11.8 (*)    HCT 35.4 (*)    All other components within normal limits  COMPREHENSIVE METABOLIC PANEL - Abnormal; Notable for the following:    CO2 20 (*)    Glucose, Bld 106 (*)    BUN 21 (*)    Calcium 8.8 (*)    ALT 12 (*)    All other components within normal limits    Imaging Review Mr Lumbar Spine Wo Contrast  06/02/2015   CLINICAL DATA:  Low back pain beginning today. Pain extends into the left lower abdomen.  EXAM: MRI LUMBAR SPINE WITHOUT CONTRAST  TECHNIQUE: Multiplanar, multisequence MR imaging of the lumbar spine was performed. No intravenous contrast was administered.  COMPARISON:  CT abdomen and pelvis of the same day.  FINDINGS: Normal signal is present in the conus medullaris which terminates at L1-2. Marrow  signal, vertebral body heights, alignment are normal. Leftward curvature lumbar spine is centered at L4. Limited imaging of the abdomen is unremarkable.  L1-2: A mild leftward disc protrusion is present without significant stenosis.  L2-3: Moderate facet hypertrophy is present bilaterally. A broad-based disc protrusion is noted. There is some distortion of the central canal without significant stenosis.  L3-4: A broad-based disc protrusion is present. Moderate facet hypertrophy is noted bilaterally. Mild subarticular narrowing is present bilaterally. The disc extends into both neural foramina without significant stenosis.  L4-5: A broad-based disc protrusion is present. Moderate facet hypertrophy and short pedicles are noted. This results in moderate subarticular stenosis bilaterally. Mild to moderate right and mild left foraminal stenosis is present.  L5-S1: A broad-based disc protrusion is present. Mild facet hypertrophy is noted bilaterally. Moderate subarticular and foraminal stenosis bilaterally is worse on the right.  IMPRESSION: 1. Multilevel spondylosis of the lumbar spine as described. 2. Moderate facet hypertrophy and  broad-based disc protrusion at L2-3 without significant stenosis. 3. Mild subarticular narrowing at L3-4 bilaterally. 4. Moderate subarticular stenosis bilaterally and mild to moderate foraminal stenosis at L4-5, worse on the right. 5. Moderate subarticular and foraminal stenosis at L5-S1 is worse on the right.   Electronically Signed   By: Marin Roberts M.D.   On: 06/02/2015 18:52   Ct Renal Stone Study  06/02/2015   CLINICAL DATA:  Left lower back pain, status post hysterectomy  EXAM: CT ABDOMEN AND PELVIS WITHOUT CONTRAST  TECHNIQUE: Multidetector CT imaging of the abdomen and pelvis was performed following the standard protocol without IV contrast.  COMPARISON:  Abdominal ultrasound 01/29/2015  FINDINGS: Sagittal images of the spine shows degenerative changes lumbar spine. Moderate anterior spurring upper endplate of L3 and L4 vertebral body. Significant disc space flattening with vacuum disc phenomenon at L4-L5 and L5-S1 level.  The lung bases are unremarkable.  Unenhanced liver shows no biliary ductal dilatation. Unenhanced pancreas, spleen and adrenal glands are unremarkable. Unenhanced kidneys are symmetrical in size. No hydronephrosis or hydroureter. No nephrolithiasis. No calcified ureteral calculi are noted bilaterally.  There is no small bowel obstruction. No ascites or free air. No adenopathy. No pericecal inflammation. The terminal ileum is unremarkable.  The patient is status post hysterectomy. Bilateral distal ureter is not dilated. Urinary bladder is under distended. Nonspecific mild thickening of urinary bladder wall. Bilateral inguinal canal tiny hernia containing fat without evidence of acute complication.  IMPRESSION: 1. There is no nephrolithiasis.  No hydronephrosis or hydroureter. 2. No calcified ureteral calculi are noted. 3. No pericecal inflammation.  No small bowel obstruction. 4. Under distended urinary bladder. Nonspecific mild thickening of urinary bladder wall. 5.  Unremarkable distal ureter bilaterally. Status post hysterectomy.   Electronically Signed   By: Natasha Mead M.D.   On: 06/02/2015 17:33     EKG Interpretation None      MDM   Final diagnoses:  Flank pain  Back pain  Lumbar radiculopathy    The scan unrevealing. In retrospect no significant vascular calcifications. On reexam patient has no appreciable abdominal complaints or tenderness this remains back flank and now complains of pain in her left leg at the groin and to the medial upper leg on the left.  MRI shows multilevel disc disease. Lower levels worse to the right. Bilateral facet arthropathy and mild stenosis bilaterally at 3-4.  Physical examination does not have weakness. She has intact reflexes and strength. She is ambulatory to the bathroom. States her pain is minimally worse with  ambulation. I spoke with her at length about this. Plan is IV Zosyn Medrol Pak at home. I do not think this represents an intra-abdominal process. She has no noted diverticuli. She does not have vascular calcifications or a history of vascular disease or multiple risks other than age. No abdominal tenderness. No diarrhea or vomiting. Discussed with her that radiculopathy would produce pain down her leg but should not give her abdominal pain nausea vomiting diarrhea fever or other symptoms. If she experiences any of these at home and asked her to return for reevaluation. She is comfortable after IV pain medications. Given IV Decadron. Discharge home. Careful return precautions discussed at length and expressed back to me by patient.    Rolland Porter, MD 06/02/15 Jerene Bears

## 2015-06-02 NOTE — Discharge Instructions (Signed)
Your MRI does show mild to moderate multilevel disc disease in you lumbar spine.  Rest. Avoid a lot of a lot of upright activity. Take steroid and pain medicine as prescribed. Pain should slowly improve.  Disc disease should not cause nausea, vomiting, diarrhea, fever, or abdominal pain. You experience any of these symptoms, please return here

## 2015-06-02 NOTE — ED Notes (Signed)
Pt states lower left back pain starting this morning, it woke her up several times throughout the night. Pain radiating into left lower abdomin. Denies any trouble/blood with urination. Pt hyperventilating in triage, crouched over in pain

## 2015-06-06 ENCOUNTER — Encounter (HOSPITAL_COMMUNITY): Payer: Self-pay | Admitting: Emergency Medicine

## 2015-06-06 ENCOUNTER — Emergency Department (HOSPITAL_COMMUNITY)
Admission: EM | Admit: 2015-06-06 | Discharge: 2015-06-06 | Disposition: A | Discharge status: Home / Self Care | Payer: Federal, State, Local not specified - PPO | Attending: Emergency Medicine | Admitting: Emergency Medicine

## 2015-06-06 DIAGNOSIS — K5900 Constipation, unspecified: Secondary | ICD-10-CM | POA: Diagnosis not present

## 2015-06-06 DIAGNOSIS — M79605 Pain in left leg: Secondary | ICD-10-CM | POA: Insufficient documentation

## 2015-06-06 DIAGNOSIS — E039 Hypothyroidism, unspecified: Secondary | ICD-10-CM | POA: Diagnosis not present

## 2015-06-06 DIAGNOSIS — R200 Anesthesia of skin: Secondary | ICD-10-CM | POA: Diagnosis not present

## 2015-06-06 DIAGNOSIS — Z87891 Personal history of nicotine dependence: Secondary | ICD-10-CM | POA: Diagnosis not present

## 2015-06-06 DIAGNOSIS — Z8679 Personal history of other diseases of the circulatory system: Secondary | ICD-10-CM | POA: Insufficient documentation

## 2015-06-06 DIAGNOSIS — M5442 Lumbago with sciatica, left side: Secondary | ICD-10-CM

## 2015-06-06 DIAGNOSIS — Z79899 Other long term (current) drug therapy: Secondary | ICD-10-CM | POA: Insufficient documentation

## 2015-06-06 DIAGNOSIS — M545 Low back pain: Secondary | ICD-10-CM | POA: Diagnosis present

## 2015-06-06 DIAGNOSIS — F329 Major depressive disorder, single episode, unspecified: Secondary | ICD-10-CM | POA: Insufficient documentation

## 2015-06-06 LAB — COMPREHENSIVE METABOLIC PANEL
ALT: 16 U/L (ref 14–54)
AST: 18 U/L (ref 15–41)
Albumin: 4.7 g/dL (ref 3.5–5.0)
Alkaline Phosphatase: 49 U/L (ref 38–126)
Anion gap: 10 (ref 5–15)
BUN: 17 mg/dL (ref 6–20)
CO2: 28 mmol/L (ref 22–32)
Calcium: 9.6 mg/dL (ref 8.9–10.3)
Chloride: 99 mmol/L — ABNORMAL LOW (ref 101–111)
Creatinine, Ser: 0.81 mg/dL (ref 0.44–1.00)
GFR calc Af Amer: >60 mL/min (ref >60)
GFR calc non Af Amer: >60 mL/min (ref >60)
Glucose, Bld: 124 mg/dL — ABNORMAL HIGH (ref 65–99)
Potassium: 4 mmol/L (ref 3.5–5.1)
Sodium: 137 mmol/L (ref 135–145)
Total Bilirubin: 0.4 mg/dL (ref 0.3–1.2)
Total Protein: 8.4 g/dL — ABNORMAL HIGH (ref 6.5–8.1)

## 2015-06-06 MED ORDER — HYDROMORPHONE HCL 1 MG/ML IJ SOLN
1.0000 mg | Freq: Once | INTRAMUSCULAR | Status: AC
  Administered 2015-06-06: 1 mg via INTRAVENOUS
  Filled 2015-06-06: qty 1

## 2015-06-06 MED ORDER — METHYLPREDNISOLONE SODIUM SUCC 125 MG IJ SOLR
125.0000 mg | Freq: Once | INTRAMUSCULAR | Status: AC
  Administered 2015-06-06: 125 mg via INTRAVENOUS
  Filled 2015-06-06: qty 2

## 2015-06-06 MED ORDER — FENTANYL CITRATE (PF) 100 MCG/2ML IJ SOLN
25.0000 ug | Freq: Once | INTRAMUSCULAR | Status: AC
  Administered 2015-06-06: 25 ug via INTRAVENOUS
  Filled 2015-06-06: qty 2

## 2015-06-06 MED ORDER — HYDROMORPHONE HCL 2 MG PO TABS: 1.0000 mg | ORAL_TABLET | ORAL | Status: DC | PRN

## 2015-06-06 MED ORDER — KETOROLAC TROMETHAMINE 30 MG/ML IJ SOLN
15.0000 mg | Freq: Once | INTRAMUSCULAR | Status: AC
  Administered 2015-06-06: 15 mg via INTRAVENOUS
  Filled 2015-06-06: qty 1

## 2015-06-06 MED ORDER — DIAZEPAM 5 MG PO TABS
5.0000 mg | ORAL_TABLET | Freq: Once | ORAL | Status: AC
  Administered 2015-06-06: 5 mg via ORAL
  Filled 2015-06-06: qty 1

## 2015-06-06 MED ORDER — SODIUM CHLORIDE 0.9 % IV BOLUS (SEPSIS)
1000.0000 mL | Freq: Once | INTRAVENOUS | Status: AC
  Administered 2015-06-06: 1000 mL via INTRAVENOUS

## 2015-06-06 MED ORDER — NAPROXEN SODIUM 220 MG PO TABS: 440.0000 mg | ORAL_TABLET | Freq: Two times a day (BID) | ORAL | Status: AC

## 2015-06-06 NOTE — ED Provider Notes (Signed)
CSN: [id number]     Arrival date & time 06/06/15  0755 History   First MD Initiated Contact with Patient 06/06/15 0759     Chief Complaint  Patient presents with  . Back Pain    HPI  Patient presents with concern of ongoing left low back pain, left leg pain. Episode began about 5 days ago. Since that time the pain is become worse, in spite of taking all medication as directed. Medication was provided during emergency department visit here 4 days ago. Pain is focally in the left thigh, with dead sensation bilaterally, pins and needles medially. No relief with steroids, narcotics. No new incontinence, falling, loss of sensation distally, no asymmetry in the leg dimensions. No abdominal pain. Patient does acknowledge constipation since she started using narcotics. No fever, no chills. Patient had MRI during her visit here, has not yet followed up with orthopedics or neurosurgery.    Past Medical History  Diagnosis Date  . Dysrhythmia 3-4 yrs ago    hx irregular heart beat, dr Earl Gala manages  . Hypothyroidism   . Depression   . GERD (gastroesophageal reflux disease)   . Diarrhea last 3 to 4 months    stomach pain  . IBS (irritable bowel syndrome)    Past Surgical History  Procedure Laterality Date  . Abdominal hysterectomy  1970's  . Thryoid surgery  1963    partial  . Left wriist ligament surgery  sev yrs ago  . Arthroscopic surgery  oct 2012    left knee  . Colonoscopy with propofol N/A 06/03/2013    Procedure: COLONOSCOPY WITH PROPOFOL;  Surgeon: Charolett Bumpers, MD;  Location: [facility];  Service: Endoscopy;  Laterality: N/A;   History reviewed. No pertinent family history. History  Substance Use Topics  . Smoking status: Former Smoker -- 0.25 packs/day for 3 years    Types: Cigarettes    Quit date: 12/11/1968  . Smokeless tobacco: Never Used  . Alcohol Use: Yes     Comment: occasional   OB History    No data available     Review of Systems    Constitutional:       Per HPI, otherwise negative  HENT:       Per HPI, otherwise negative  Respiratory:       Per HPI, otherwise negative  Cardiovascular:       Per HPI, otherwise negative  Gastrointestinal: Positive for constipation. Negative for vomiting and abdominal pain.  Endocrine:       Negative aside from HPI  Genitourinary:       Neg aside from HPI   Musculoskeletal:       Per HPI, otherwise negative  Skin:       Small bruising on the left medial thigh  Allergic/Immunologic: Negative for immunocompromised state.  Neurological: Positive for numbness. Negative for syncope and weakness.      Allergies  Codeine  Home Medications   Prior to Admission medications   Medication Sig Start Date End Date Taking? Authorizing Provider  acetaminophen (TYLENOL) 500 MG tablet Take 500-1,000 mg by mouth every 6 (six) hours as needed for headache (headache).     Historical Provider, MD  Cholecalciferol (VITAMIN D) 2000 UNITS CAPS Take 1 capsule by mouth every evening.     Historical Provider, MD  citalopram (CELEXA) 10 MG tablet Take 5-10 mg by mouth every morning. Takes 5 mg one day and 10 mg  Next day alternates dosages    Historical Provider, MD  estrogens, conjugated, (PREMARIN) 0.45 MG tablet Take 0.45 mg by mouth daily.     Historical Provider, MD  levothyroxine (SYNTHROID, LEVOTHROID) 112 MCG tablet Take 112 mcg by mouth daily before breakfast.    Historical Provider, MD  methylPREDNISolone (MEDROL DOSEPAK) 4 MG TBPK tablet 6 by mouth on day 1. Decrease by 1 pill per day. SplintsDisp:  #21 tabs 06/02/15   Rolland Porter, MD  metoprolol (LOPRESSOR) 50 MG tablet Take 50 mg by mouth at bedtime.    Historical Provider, MD  naproxen sodium (ANAPROX) 220 MG tablet Take 440 mg by mouth 2 (two) times daily as needed (back pain).    Historical Provider, MD  ondansetron (ZOFRAN ODT) 4 MG disintegrating tablet Take 1 tablet (4 mg total) by mouth every 8 (eight) hours as needed for nausea.  06/02/15   Rolland Porter, MD  oxyCODONE-acetaminophen (PERCOCET/ROXICET) 5-325 MG per tablet Take 2 tablets by mouth every 4 (four) hours as needed. 06/02/15   Rolland Porter, MD   BP 162/93 mmHg  Pulse 72  Temp(Src) 98.5 F (36.9 C) (Oral)  Resp 16  SpO2 97% Physical Exam  Constitutional: She is oriented to person, place, and time. She appears well-developed and well-nourished. No distress.  HENT:  Head: Normocephalic and atraumatic.  Eyes: Conjunctivae and EOM are normal.  Cardiovascular: Normal rate and regular rhythm.   Pulmonary/Chest: Effort normal and breath sounds normal. No stridor. No respiratory distress.  Abdominal: She exhibits no distension.    Musculoskeletal: She exhibits no edema.       Arms: Neurological: She is alert and oriented to person, place, and time. She displays no atrophy and no tremor. No cranial nerve deficit or sensory deficit. She exhibits normal muscle tone. She displays no seizure activity. Coordination normal.  Strength in the left lower extremity, sensation are both intact, equal with the contralateral side, proximally and distally.   Skin: Skin is warm and dry.  Psychiatric: She has a normal mood and affect.  Nursing note and vitals reviewed.   ED Course  Procedures (including critical care time) Labs Review Labs Reviewed  COMPREHENSIVE METABOLIC PANEL - Abnormal; Notable for the following:    Chloride 99 (*)    Glucose, Bld 124 (*)    Total Protein 8.4 (*)    All other components within normal limits    Imaging Review  MRI results from 4d pta ago reviewed   After the initial evaluation I reviewed the patient's chart, including MRI results. Patient has notable disc disease, degenerative disease.  9:24 AM Patient still in pain   10:58 AM Pain minimal. She, her husband and I had a lengthy conversation about pain management, follow-up with neurosurgery for further evaluation, management, consideration of injections, versus physical  therapy.  MDM   Final diagnoses:  Left-sided low back pain with left-sided sciatica   Patient presents with ongoing left low back pain, radicular pain throughout the left leg. Physical exam findings consistent with DVT or other etiology. Patient is objectively neurovascularly intact. Patient is afebrile. Patient has recent MRI demonstrating notable disease in the lumbar spine, consistent with radicular discomfort. Here patient received IV steroids bolus, narcotics, benzodiazepine, and again narcotics to achieve substantial pain reduction. Patient was discharged on a similar regimen, to follow-up with neurosurgery. With no evidence for neurovascular compromise, fever, there is low suspicion for either acute progression of her spinal disease or infection as contributory etiology.   Gerhard Munch, MD 06/06/15 1100

## 2015-06-06 NOTE — ED Notes (Signed)
Pt reports lower back pain and L leg for past week. Was here several days ago for same. Was given prescription for steroids and pain medication, but pain not relieved.

## 2015-06-06 NOTE — ED Notes (Signed)
RN will draw labs. 

## 2015-06-06 NOTE — Discharge Instructions (Signed)
As discussed, it is important that you follow up with our neurosurgical colleagues for continued management of your condition.  If you develop any new, or concerning changes in your condition, please return to the emergency department immediately.

## 2015-06-08 ENCOUNTER — Encounter (HOSPITAL_COMMUNITY): Payer: Self-pay | Admitting: *Deleted

## 2015-06-08 ENCOUNTER — Other Ambulatory Visit: Payer: Self-pay | Admitting: Neurosurgery

## 2015-06-09 ENCOUNTER — Ambulatory Visit (HOSPITAL_COMMUNITY): Payer: Federal, State, Local not specified - PPO

## 2015-06-09 ENCOUNTER — Ambulatory Visit (HOSPITAL_COMMUNITY)
Admission: RE | Admit: 2015-06-09 | Discharge: 2015-06-10 | Disposition: A | Discharge status: Home / Self Care | Payer: Federal, State, Local not specified - PPO | Source: Ambulatory Visit | Attending: Neurosurgery | Admitting: Neurosurgery

## 2015-06-09 ENCOUNTER — Ambulatory Visit (HOSPITAL_COMMUNITY): Payer: Federal, State, Local not specified - PPO | Admitting: Anesthesiology

## 2015-06-09 ENCOUNTER — Encounter (HOSPITAL_COMMUNITY)
Admission: RE | Disposition: A | Discharge status: Home / Self Care | Payer: Self-pay | Source: Ambulatory Visit | Attending: Neurosurgery

## 2015-06-09 ENCOUNTER — Encounter (HOSPITAL_COMMUNITY): Payer: Self-pay | Admitting: *Deleted

## 2015-06-09 DIAGNOSIS — Z800 Family history of malignant neoplasm of digestive organs: Secondary | ICD-10-CM | POA: Diagnosis not present

## 2015-06-09 DIAGNOSIS — Z90710 Acquired absence of both cervix and uterus: Secondary | ICD-10-CM | POA: Diagnosis not present

## 2015-06-09 DIAGNOSIS — K219 Gastro-esophageal reflux disease without esophagitis: Secondary | ICD-10-CM | POA: Diagnosis not present

## 2015-06-09 DIAGNOSIS — M1990 Unspecified osteoarthritis, unspecified site: Secondary | ICD-10-CM | POA: Insufficient documentation

## 2015-06-09 DIAGNOSIS — Z808 Family history of malignant neoplasm of other organs or systems: Secondary | ICD-10-CM | POA: Insufficient documentation

## 2015-06-09 DIAGNOSIS — K589 Irritable bowel syndrome without diarrhea: Secondary | ICD-10-CM | POA: Insufficient documentation

## 2015-06-09 DIAGNOSIS — E039 Hypothyroidism, unspecified: Secondary | ICD-10-CM | POA: Diagnosis not present

## 2015-06-09 DIAGNOSIS — Z91040 Latex allergy status: Secondary | ICD-10-CM | POA: Insufficient documentation

## 2015-06-09 DIAGNOSIS — Z87891 Personal history of nicotine dependence: Secondary | ICD-10-CM | POA: Diagnosis not present

## 2015-06-09 DIAGNOSIS — Z419 Encounter for procedure for purposes other than remedying health state, unspecified: Secondary | ICD-10-CM

## 2015-06-09 DIAGNOSIS — Z885 Allergy status to narcotic agent status: Secondary | ICD-10-CM | POA: Insufficient documentation

## 2015-06-09 DIAGNOSIS — M5116 Intervertebral disc disorders with radiculopathy, lumbar region: Secondary | ICD-10-CM | POA: Diagnosis present

## 2015-06-09 DIAGNOSIS — F419 Anxiety disorder, unspecified: Secondary | ICD-10-CM | POA: Insufficient documentation

## 2015-06-09 DIAGNOSIS — M5126 Other intervertebral disc displacement, lumbar region: Secondary | ICD-10-CM | POA: Diagnosis present

## 2015-06-09 HISTORY — DX: Anemia, unspecified: D64.9

## 2015-06-09 HISTORY — DX: Unspecified osteoarthritis, unspecified site: M19.90

## 2015-06-09 HISTORY — DX: Nausea with vomiting, unspecified: R11.2

## 2015-06-09 HISTORY — DX: Anxiety disorder, unspecified: F41.9

## 2015-06-09 HISTORY — PX: LUMBAR LAMINECTOMY/DECOMPRESSION MICRODISCECTOMY: SHX5026

## 2015-06-09 HISTORY — DX: Other specified postprocedural states: Z98.890

## 2015-06-09 LAB — SURGICAL PCR SCREEN
MRSA, PCR: NEGATIVE
Staphylococcus aureus: NEGATIVE

## 2015-06-09 SURGERY — LUMBAR LAMINECTOMY/DECOMPRESSION MICRODISCECTOMY 1 LEVEL
Anesthesia: General | Site: Back | Laterality: Left

## 2015-06-09 MED ORDER — MUPIROCIN 2 % EX OINT
1.0000 "application " | TOPICAL_OINTMENT | Freq: Once | CUTANEOUS | Status: AC
  Administered 2015-06-09: 1 via TOPICAL
  Filled 2015-06-09: qty 22

## 2015-06-09 MED ORDER — DEXAMETHASONE SODIUM PHOSPHATE 10 MG/ML IJ SOLN
INTRAMUSCULAR | Status: AC
  Administered 2015-06-09: 4 mg
  Filled 2015-06-09: qty 1

## 2015-06-09 MED ORDER — CEFAZOLIN SODIUM-DEXTROSE 2-3 GM-% IV SOLR
INTRAVENOUS | Status: AC
  Filled 2015-06-09: qty 50

## 2015-06-09 MED ORDER — LABETALOL HCL 5 MG/ML IV SOLN
INTRAVENOUS | Status: DC | PRN
  Administered 2015-06-09 (×2): 5 mg via INTRAVENOUS

## 2015-06-09 MED ORDER — ONDANSETRON HCL 4 MG/2ML IJ SOLN
INTRAMUSCULAR | Status: DC | PRN
  Administered 2015-06-09: 4 mg via INTRAVENOUS

## 2015-06-09 MED ORDER — DEXAMETHASONE SODIUM PHOSPHATE 4 MG/ML IJ SOLN: 4.0000 mg | Freq: Once | INTRAMUSCULAR | Status: AC

## 2015-06-09 MED ORDER — BISACODYL 10 MG RE SUPP
10.0000 mg | Freq: Every day | RECTAL | Status: DC | PRN
  Administered 2015-06-09: 10 mg via RECTAL
  Filled 2015-06-09: qty 1

## 2015-06-09 MED ORDER — CITALOPRAM HYDROBROMIDE 10 MG PO TABS
10.0000 mg | ORAL_TABLET | ORAL | Status: DC
  Administered 2015-06-10: 10 mg via ORAL
  Filled 2015-06-09: qty 1

## 2015-06-09 MED ORDER — LACTATED RINGERS IV SOLN
INTRAVENOUS | Status: DC
  Administered 2015-06-09: 13:00:00 via INTRAVENOUS

## 2015-06-09 MED ORDER — ACETAMINOPHEN 325 MG PO TABS: 325.0000 mg | ORAL_TABLET | ORAL | Status: DC | PRN

## 2015-06-09 MED ORDER — PHENYLEPHRINE 40 MCG/ML (10ML) SYRINGE FOR IV PUSH (FOR BLOOD PRESSURE SUPPORT)
PREFILLED_SYRINGE | INTRAVENOUS | Status: AC
  Filled 2015-06-09: qty 30

## 2015-06-09 MED ORDER — THROMBIN 5000 UNITS EX SOLR
CUTANEOUS | Status: DC | PRN
  Administered 2015-06-09 (×2): 5000 [IU] via TOPICAL

## 2015-06-09 MED ORDER — BUPIVACAINE-EPINEPHRINE (PF) 0.5% -1:200000 IJ SOLN
INTRAMUSCULAR | Status: DC | PRN
  Administered 2015-06-09: 10 mL

## 2015-06-09 MED ORDER — HEMOSTATIC AGENTS (NO CHARGE) OPTIME
TOPICAL | Status: DC | PRN
  Administered 2015-06-09: 1 via TOPICAL

## 2015-06-09 MED ORDER — DIAZEPAM 5 MG PO TABS
ORAL_TABLET | ORAL | Status: AC
  Filled 2015-06-09: qty 1

## 2015-06-09 MED ORDER — DEXAMETHASONE SODIUM PHOSPHATE 4 MG/ML IJ SOLN
INTRAMUSCULAR | Status: AC
  Filled 2015-06-09: qty 2

## 2015-06-09 MED ORDER — ACETAMINOPHEN 650 MG RE SUPP: 650.0000 mg | RECTAL | Status: DC | PRN

## 2015-06-09 MED ORDER — HYDROMORPHONE HCL 1 MG/ML IJ SOLN
INTRAMUSCULAR | Status: AC
  Administered 2015-06-09: 0.5 mg
  Filled 2015-06-09: qty 1

## 2015-06-09 MED ORDER — OXYCODONE HCL 5 MG/5ML PO SOLN: 5.0000 mg | Freq: Once | ORAL | Status: AC | PRN

## 2015-06-09 MED ORDER — FENTANYL CITRATE (PF) 100 MCG/2ML IJ SOLN
INTRAMUSCULAR | Status: DC | PRN
  Administered 2015-06-09: 100 ug via INTRAVENOUS
  Administered 2015-06-09: 150 ug via INTRAVENOUS

## 2015-06-09 MED ORDER — MENTHOL 3 MG MT LOZG: 1.0000 | LOZENGE | OROMUCOSAL | Status: DC | PRN

## 2015-06-09 MED ORDER — VECURONIUM BROMIDE 10 MG IV SOLR
INTRAVENOUS | Status: DC | PRN
  Administered 2015-06-09: 7 mg via INTRAVENOUS

## 2015-06-09 MED ORDER — OXYCODONE HCL 5 MG PO TABS
5.0000 mg | ORAL_TABLET | Freq: Once | ORAL | Status: AC | PRN
  Administered 2015-06-09: 5 mg via ORAL

## 2015-06-09 MED ORDER — ONDANSETRON HCL 4 MG/2ML IJ SOLN
INTRAMUSCULAR | Status: AC
  Filled 2015-06-09: qty 4

## 2015-06-09 MED ORDER — ACETAMINOPHEN 325 MG PO TABS: 650.0000 mg | ORAL_TABLET | ORAL | Status: DC | PRN

## 2015-06-09 MED ORDER — FENTANYL CITRATE (PF) 250 MCG/5ML IJ SOLN
INTRAMUSCULAR | Status: AC
  Filled 2015-06-09: qty 5

## 2015-06-09 MED ORDER — OXYCODONE HCL 5 MG PO TABS
ORAL_TABLET | ORAL | Status: AC
  Filled 2015-06-09: qty 1

## 2015-06-09 MED ORDER — CITALOPRAM HYDROBROMIDE 10 MG PO TABS: 5.0000 mg | ORAL_TABLET | ORAL | Status: DC

## 2015-06-09 MED ORDER — NEOSTIGMINE METHYLSULFATE 10 MG/10ML IV SOLN
INTRAVENOUS | Status: DC | PRN
  Administered 2015-06-09: 4 mg via INTRAVENOUS

## 2015-06-09 MED ORDER — LEVOTHYROXINE SODIUM 112 MCG PO TABS
112.0000 ug | ORAL_TABLET | Freq: Every day | ORAL | Status: DC
  Administered 2015-06-10: 112 ug via ORAL
  Filled 2015-06-09 (×2): qty 1

## 2015-06-09 MED ORDER — CITALOPRAM HYDROBROMIDE 10 MG PO TABS: 5.0000 mg | ORAL_TABLET | Freq: Every morning | ORAL | Status: DC

## 2015-06-09 MED ORDER — DOCUSATE SODIUM 100 MG PO CAPS
100.0000 mg | ORAL_CAPSULE | Freq: Two times a day (BID) | ORAL | Status: DC
  Administered 2015-06-09 – 2015-06-10 (×2): 100 mg via ORAL
  Filled 2015-06-09 (×2): qty 1

## 2015-06-09 MED ORDER — PROMETHAZINE HCL 25 MG/ML IJ SOLN
6.2500 mg | INTRAMUSCULAR | Status: DC | PRN
Start: 2015-06-09 — End: 2015-06-10

## 2015-06-09 MED ORDER — DEXAMETHASONE SODIUM PHOSPHATE 4 MG/ML IJ SOLN
INTRAMUSCULAR | Status: DC | PRN
  Administered 2015-06-09: 8 mg via INTRAVENOUS

## 2015-06-09 MED ORDER — MORPHINE SULFATE 2 MG/ML IJ SOLN
1.0000 mg | INTRAMUSCULAR | Status: DC | PRN
  Administered 2015-06-09 (×2): 2 mg via INTRAVENOUS
  Filled 2015-06-09 (×2): qty 1

## 2015-06-09 MED ORDER — MIDAZOLAM HCL 2 MG/2ML IJ SOLN
INTRAMUSCULAR | Status: AC
  Filled 2015-06-09: qty 2

## 2015-06-09 MED ORDER — LIDOCAINE HCL (CARDIAC) 20 MG/ML IV SOLN
INTRAVENOUS | Status: AC
  Filled 2015-06-09: qty 15

## 2015-06-09 MED ORDER — HYDROMORPHONE HCL 2 MG PO TABS
4.0000 mg | ORAL_TABLET | ORAL | Status: DC | PRN
  Administered 2015-06-09: 4 mg via ORAL
  Filled 2015-06-09 (×2): qty 2

## 2015-06-09 MED ORDER — METOPROLOL TARTRATE 50 MG PO TABS
50.0000 mg | ORAL_TABLET | Freq: Every day | ORAL | Status: DC
  Administered 2015-06-09: 50 mg via ORAL
  Filled 2015-06-09 (×2): qty 1

## 2015-06-09 MED ORDER — HYDROMORPHONE HCL 1 MG/ML IJ SOLN: 0.2500 mg | INTRAMUSCULAR | Status: DC | PRN

## 2015-06-09 MED ORDER — LABETALOL HCL 5 MG/ML IV SOLN
INTRAVENOUS | Status: AC
  Filled 2015-06-09: qty 4

## 2015-06-09 MED ORDER — ALUM & MAG HYDROXIDE-SIMETH 200-200-20 MG/5ML PO SUSP: 30.0000 mL | Freq: Four times a day (QID) | ORAL | Status: DC | PRN

## 2015-06-09 MED ORDER — PHENOL 1.4 % MT LIQD: 1.0000 | OROMUCOSAL | Status: DC | PRN

## 2015-06-09 MED ORDER — SODIUM CHLORIDE 0.9 % IR SOLN
Status: DC | PRN
  Administered 2015-06-09: 18:00:00

## 2015-06-09 MED ORDER — ONDANSETRON HCL 4 MG/2ML IJ SOLN: 4.0000 mg | INTRAMUSCULAR | Status: DC | PRN

## 2015-06-09 MED ORDER — PROPOFOL 10 MG/ML IV BOLUS
INTRAVENOUS | Status: DC | PRN
  Administered 2015-06-09: 160 mg via INTRAVENOUS

## 2015-06-09 MED ORDER — CEFAZOLIN SODIUM-DEXTROSE 2-3 GM-% IV SOLR
INTRAVENOUS | Status: DC | PRN
  Administered 2015-06-09: 2 g via INTRAVENOUS

## 2015-06-09 MED ORDER — MIDAZOLAM HCL 2 MG/2ML IJ SOLN
0.5000 mg | INTRAMUSCULAR | Status: DC | PRN
  Administered 2015-06-09: 0.5 mg via INTRAVENOUS

## 2015-06-09 MED ORDER — GLYCOPYRROLATE 0.2 MG/ML IJ SOLN
INTRAMUSCULAR | Status: DC | PRN
  Administered 2015-06-09: .7 mg via INTRAVENOUS

## 2015-06-09 MED ORDER — ESTROGENS CONJUGATED 0.45 MG PO TABS
0.4500 mg | ORAL_TABLET | Freq: Every day | ORAL | Status: DC
  Administered 2015-06-10: 0.45 mg via ORAL
  Filled 2015-06-09: qty 1

## 2015-06-09 MED ORDER — 0.9 % SODIUM CHLORIDE (POUR BTL) OPTIME
TOPICAL | Status: DC | PRN
  Administered 2015-06-09: 1000 mL

## 2015-06-09 MED ORDER — PROPOFOL 10 MG/ML IV BOLUS
INTRAVENOUS | Status: AC
  Filled 2015-06-09: qty 20

## 2015-06-09 MED ORDER — DIAZEPAM 5 MG PO TABS
5.0000 mg | ORAL_TABLET | Freq: Four times a day (QID) | ORAL | Status: DC | PRN
  Administered 2015-06-09 (×2): 5 mg via ORAL
  Filled 2015-06-09: qty 1

## 2015-06-09 MED ORDER — LACTATED RINGERS IV SOLN
INTRAVENOUS | Status: DC | PRN
  Administered 2015-06-09 (×2): via INTRAVENOUS

## 2015-06-09 MED ORDER — ACETAMINOPHEN 160 MG/5ML PO SOLN: 325.0000 mg | ORAL | Status: DC | PRN

## 2015-06-09 MED ORDER — HYDROMORPHONE HCL 1 MG/ML IJ SOLN
0.2500 mg | INTRAMUSCULAR | Status: DC | PRN
  Administered 2015-06-09: 0.5 mg via INTRAVENOUS

## 2015-06-09 MED ORDER — LACTATED RINGERS IV SOLN: INTRAVENOUS | Status: DC

## 2015-06-09 MED ORDER — CEFAZOLIN SODIUM-DEXTROSE 2-3 GM-% IV SOLR
2.0000 g | Freq: Three times a day (TID) | INTRAVENOUS | Status: DC
  Administered 2015-06-09: 2 g via INTRAVENOUS
  Filled 2015-06-09 (×2): qty 50

## 2015-06-09 MED ORDER — ARTIFICIAL TEARS OP OINT
TOPICAL_OINTMENT | OPHTHALMIC | Status: AC
  Filled 2015-06-09: qty 10.5

## 2015-06-09 MED ORDER — MIDAZOLAM HCL 2 MG/2ML IJ SOLN
INTRAMUSCULAR | Status: AC
  Administered 2015-06-09: 0.5 mg
  Filled 2015-06-09: qty 2

## 2015-06-09 MED ORDER — HYDROMORPHONE HCL 2 MG/ML IJ SOLN: 2.0000 mg | Freq: Once | INTRAMUSCULAR | Status: DC

## 2015-06-09 MED ORDER — HYDROMORPHONE HCL 1 MG/ML IJ SOLN
INTRAMUSCULAR | Status: AC
  Filled 2015-06-09: qty 2

## 2015-06-09 MED ORDER — MAGNESIUM CITRATE PO SOLN
1.0000 | Freq: Once | ORAL | Status: DC
  Filled 2015-06-09: qty 296

## 2015-06-09 MED ORDER — BACITRACIN ZINC 500 UNIT/GM EX OINT
TOPICAL_OINTMENT | CUTANEOUS | Status: DC | PRN
  Administered 2015-06-09: 1 via TOPICAL

## 2015-06-09 SURGICAL SUPPLY — 58 items
BAG DECANTER FOR FLEXI CONT (MISCELLANEOUS) ×2 IMPLANT
BENZOIN TINCTURE PRP APPL 2/3 (GAUZE/BANDAGES/DRESSINGS) ×2 IMPLANT
BLADE CLIPPER SURG (BLADE) IMPLANT
BRUSH SCRUB EZ PLAIN DRY (MISCELLANEOUS) ×2 IMPLANT
BUR MATCHSTICK NEURO 3.0 LAGG (BURR) ×2 IMPLANT
BUR PRECISION FLUTE 6.0 (BURR) ×2 IMPLANT
CANISTER SUCT 3000ML PPV (MISCELLANEOUS) ×2 IMPLANT
CONT SPEC 4OZ CLIKSEAL STRL BL (MISCELLANEOUS) ×2 IMPLANT
DRAPE LAPAROTOMY 100X72X124 (DRAPES) ×2 IMPLANT
DRAPE MICROSCOPE LEICA (MISCELLANEOUS) ×2 IMPLANT
DRAPE POUCH INSTRU U-SHP 10X18 (DRAPES) ×2 IMPLANT
DRAPE SURG 17X23 STRL (DRAPES) ×8 IMPLANT
ELECT BLADE 4.0 EZ CLEAN MEGAD (MISCELLANEOUS) ×2
ELECT REM PT RETURN 9FT ADLT (ELECTROSURGICAL) ×2
ELECTRODE BLDE 4.0 EZ CLN MEGD (MISCELLANEOUS) ×1 IMPLANT
ELECTRODE REM PT RTRN 9FT ADLT (ELECTROSURGICAL) ×1 IMPLANT
GAUZE SPONGE 4X4 12PLY STRL (GAUZE/BANDAGES/DRESSINGS) ×2 IMPLANT
GAUZE SPONGE 4X4 16PLY XRAY LF (GAUZE/BANDAGES/DRESSINGS) IMPLANT
GLOVE BIO SURGEON STRL SZ8 (GLOVE) IMPLANT
GLOVE BIO SURGEON STRL SZ8.5 (GLOVE) IMPLANT
GLOVE BIOGEL PI IND STRL 6.5 (GLOVE) ×1 IMPLANT
GLOVE BIOGEL PI IND STRL 8 (GLOVE) ×2 IMPLANT
GLOVE BIOGEL PI INDICATOR 6.5 (GLOVE) ×1
GLOVE BIOGEL PI INDICATOR 8 (GLOVE) ×2
GLOVE EXAM NITRILE LRG STRL (GLOVE) IMPLANT
GLOVE EXAM NITRILE MD LF STRL (GLOVE) IMPLANT
GLOVE EXAM NITRILE XL STR (GLOVE) IMPLANT
GLOVE EXAM NITRILE XS STR PU (GLOVE) IMPLANT
GLOVE SURG SS PI 6.5 STRL IVOR (GLOVE) ×4 IMPLANT
GLOVE SURG SS PI 7.0 STRL IVOR (GLOVE) ×2 IMPLANT
GLOVE SURG SS PI 8.0 STRL IVOR (GLOVE) ×2 IMPLANT
GLOVE SURG SS PI 8.5 STRL IVOR (GLOVE) ×1
GLOVE SURG SS PI 8.5 STRL STRW (GLOVE) ×1 IMPLANT
GOWN STRL REUS W/ TWL LRG LVL3 (GOWN DISPOSABLE) ×1 IMPLANT
GOWN STRL REUS W/ TWL XL LVL3 (GOWN DISPOSABLE) ×1 IMPLANT
GOWN STRL REUS W/TWL 2XL LVL3 (GOWN DISPOSABLE) ×2 IMPLANT
GOWN STRL REUS W/TWL LRG LVL3 (GOWN DISPOSABLE) ×1
GOWN STRL REUS W/TWL XL LVL3 (GOWN DISPOSABLE) ×1
KIT BASIN OR (CUSTOM PROCEDURE TRAY) ×2 IMPLANT
KIT ROOM TURNOVER OR (KITS) ×2 IMPLANT
NEEDLE HYPO 21X1.5 SAFETY (NEEDLE) IMPLANT
NEEDLE HYPO 22GX1.5 SAFETY (NEEDLE) ×2 IMPLANT
NS IRRIG 1000ML POUR BTL (IV SOLUTION) ×2 IMPLANT
PACK LAMINECTOMY NEURO (CUSTOM PROCEDURE TRAY) ×2 IMPLANT
PAD ARMBOARD 7.5X6 YLW CONV (MISCELLANEOUS) ×6 IMPLANT
PATTIES SURGICAL .5 X1 (DISPOSABLE) IMPLANT
RUBBERBAND STERILE (MISCELLANEOUS) ×4 IMPLANT
SPONGE SURGIFOAM ABS GEL SZ50 (HEMOSTASIS) ×2 IMPLANT
STRIP CLOSURE SKIN 1/2X4 (GAUZE/BANDAGES/DRESSINGS) ×2 IMPLANT
SUT VIC AB 1 CT1 18XBRD ANBCTR (SUTURE) ×1 IMPLANT
SUT VIC AB 1 CT1 8-18 (SUTURE) ×1
SUT VIC AB 2-0 CP2 18 (SUTURE) ×2 IMPLANT
SYR 20CC LL (SYRINGE) IMPLANT
SYR 20ML ECCENTRIC (SYRINGE) ×2 IMPLANT
TAPE CLOTH SURG 4X10 WHT LF (GAUZE/BANDAGES/DRESSINGS) ×2 IMPLANT
TOWEL OR 17X24 6PK STRL BLUE (TOWEL DISPOSABLE) ×2 IMPLANT
TOWEL OR 17X26 10 PK STRL BLUE (TOWEL DISPOSABLE) ×2 IMPLANT
WATER STERILE IRR 1000ML POUR (IV SOLUTION) ×2 IMPLANT

## 2015-06-09 NOTE — Progress Notes (Signed)
1315 Pt. Stating pain 9 on  A scale  Of 1-10. Requesting pain medications. Notified Dr. Okey Dupreose, new orders received for pain medication stated she  Could have up to  A total of 2 mg of dilaudid . Dr. Okey Dupreose made aware pt. Has not signed consent ,stated ok to give pain medicaton.

## 2015-06-09 NOTE — Transfer of Care (Signed)
Immediate Anesthesia Transfer of Care Note  Patient: Jean BostonJane C Jennings  Procedure(s) Performed: Procedure(s) with comments: Left Lumbar two-three Microdiskectomy (Left) - Left L2-3 Microdiskectomy  Patient Location: PACU  Anesthesia Type:General  Level of Consciousness: awake, alert  and oriented  Airway & Oxygen Therapy: Patient Spontanous Breathing and Patient connected to nasal cannula oxygen  Post-op Assessment: Report given to RN, Post -op Vital signs reviewed and stable and Patient moving all extremities X 4  Post vital signs: Reviewed and stable  Last Vitals:  Filed Vitals:   06/09/15 1249  BP: 166/70  Pulse: 75  Temp: 36.7 C  Resp: 16    Complications: No apparent anesthesia complications

## 2015-06-09 NOTE — Anesthesia Postprocedure Evaluation (Signed)
  Anesthesia Post-op Note  Patient: Jean BostonJane C Gunby  Procedure(s) Performed: Procedure(s) with comments: Left Lumbar two-three Microdiskectomy (Left) - Left L2-3 Microdiskectomy  Patient Location: PACU  Anesthesia Type:General  Level of Consciousness: awake  Airway and Oxygen Therapy: Patient Spontanous Breathing  Post-op Pain: mild  Post-op Assessment: Post-op Vital signs reviewed              Post-op Vital Signs: Reviewed  Last Vitals:  Filed Vitals:   06/09/15 2045  BP: 176/64  Pulse: 60  Temp: 36.4 C  Resp: 18    Complications: No apparent anesthesia complications

## 2015-06-09 NOTE — Op Note (Signed)
Brief history: The patient is a 74 year old white female who has complained of back and left leg pain consistent with a left L2 radiculopathy. She has failed medical management and was worked up with a lumbar MRI. This demonstrated a far lateral herniated disc at L2-3 on the left. I discussed the various treatment options with the patient including surgery. She has weighed the risks, benefits, and alternative surgery and decided proceed with a left L2-3 far lateral discectomy.  Preoperative diagnosis: Left L2-3 far lateral herniated disc, lumbago, lumbar radiculopathy  Postoperative diagnosis: The same  Procedure: Left L2-3 far lateral Intervertebral discectomy using micro-dissection  Surgeon: Dr. Delma OfficerJeff Colson Barco  Asst.: Dr. Marikay Alaravid Jones  Anesthesia: Gen. endotracheal  Estimated blood loss: Normal  Drains: None  Complications: None  Description of procedure: The patient was brought to the operating room by the anesthesia team. General endotracheal anesthesia was induced. The patient was turned to the prone position on the Wilson frame. The patient's lumbosacral region was then prepared with Betadine scrub and Betadine solution. Sterile drapes were applied.  I then injected the area to be incised with Marcaine with epinephrine solution. I then used a scalpel to make a linear midline incision over the L2-3 intervertebral disc space. I then used electrocautery to perform a left sided subperiosteal dissection exposing the spinous process and lamina of L2 and L3. We obtained intraoperative radiograph to confirm our location. I then inserted the [facility] ret[facility] for exposure.  We then brought the operative microscope into the field. Under its magnification and illumination we completed the microdissection. I used a high-speed drill to drill off the lateral aspect of the left L2 pars. I then used microdissection to dissected inner transverse ligament. We removed the ligament with a Kerrison punch  and use microdissection to identify the exiting L2 nerve root. We dissected around the nerve root and identified a far lateral herniated disks out laterally. We removed multiple fragments using the micropituitary forceps. We then inspected the intervertebral disc. There was a small hole in the annulus. There did not appear to be in any impending herniation and we did not perform an intervertebral discectomy.  I then palpated along the ventral surface of the thecal sac and along exit route of the left L2 nerve root nerve root from its intraspinal portion through the foramen out into the soft tissues and noted that the neural structures were well decompressed. This completed the decompression.  We then obtained hemostasis using bipolar electrocautery. We irrigated the wound out with bacitracin solution. We then removed the retractor. We then reapproximated the patient's thoracolumbar fascia with interrupted #1 Vicryl suture. We then reapproximated the patient's subcutaneous tissue with interrupted 2-0 Vicryl suture. We then reapproximated patient's skin with Steri-Strips and benzoin. The was then coated with bacitracin ointment. The drapes were removed. The patient was subsequently returned to the supine position where they were extubated by the anesthesia team. The patient was then transported to the postanesthesia care unit in stable condition. All sponge instrument and needle counts were reportedly correct at the end of this case.

## 2015-06-09 NOTE — H&P (Cosign Needed)
Subjective: Jean Jennings is a 74 year old white female who has complained of back and left leg pain consistent with a lumbar radiculopathy. She has failed medical management and was worked up with a lumbar MRI which demonstrated a herniated disc at L2-3 on the left. I discussed the various treatment options with the patient and her husband. She has weighed the risks, benefits, and alternative surgery and decided proceed with a left L2-3 discectomy.   Past Medical History  Diagnosis Date  . Dysrhythmia 3-4 yrs ago    hx irregular heart beat, dr Earl Gala manages  . Hypothyroidism   . GERD (gastroesophageal reflux disease)   . Diarrhea last 3 to 4 months    stomach pain  . IBS (irritable bowel syndrome)   . Anxiety   . Arthritis   . Anemia     as a younger woman  . PONV (postoperative nausea and vomiting)     " a little bit"    Past Surgical History  Procedure Laterality Date  . Abdominal hysterectomy  1970's  . Thryoid surgery  1963    partial  . Left wriist ligament surgery  sev yrs ago  . Arthroscopic surgery  oct 2012    left knee  . Colonoscopy with propofol N/A 06/03/2013    Procedure: COLONOSCOPY WITH PROPOFOL;  Surgeon: Charolett Bumpers, MD;  Location: [facility];  Service: Endoscopy;  Laterality: N/A;  . Tonsillectomy      Allergies  Allergen Reactions  . Codeine Nausea And Vomiting    Lightheaded also  . Latex Other (See Comments)    Reddens skin and makes it raw     History  Substance Use Topics  . Smoking status: Former Smoker -- 0.25 packs/day for 3 years    Types: Cigarettes    Quit date: 12/11/1968  . Smokeless tobacco: Never Used  . Alcohol Use: Yes     Comment: occasional    Family History  Problem Relation Age of Onset  . Liver cancer Mother   . Colon cancer Mother   . Bone cancer Father    Prior to Admission medications   Medication Sig Start Date End Date Taking? Authorizing Provider  acetaminophen (TYLENOL) 500 MG tablet Take 500-1,000 mg by mouth  every 6 (six) hours as needed for headache (headache).    Yes Historical Provider, MD  Cholecalciferol (VITAMIN D) 2000 UNITS CAPS Take 1 capsule by mouth every evening.    Yes Historical Provider, MD  citalopram (CELEXA) 10 MG tablet Take 5-10 mg by mouth every morning. Takes 5 mg one day and 10 mg  Next day alternates dosages   Yes Historical Provider, MD  estrogens, conjugated, (PREMARIN) 0.45 MG tablet Take 0.45 mg by mouth daily.    Yes Historical Provider, MD  HYDROmorphone (DILAUDID) 2 MG tablet Take 0.5 tablets (1 mg total) by mouth every 4 (four) hours as needed for severe pain. 06/06/15  Yes Gerhard Munch, MD  levothyroxine (SYNTHROID, LEVOTHROID) 112 MCG tablet Take 112 mcg by mouth daily before breakfast.   Yes Historical Provider, MD  methylPREDNISolone (MEDROL DOSEPAK) 4 MG TBPK tablet 6 by mouth on day 1. Decrease by 1 pill per day. SplintsDisp:  #21 tabs 06/02/15  Yes Rolland Porter, MD  metoprolol (LOPRESSOR) 50 MG tablet Take 50 mg by mouth at bedtime.   Yes Historical Provider, MD  naproxen sodium (ANAPROX) 220 MG tablet Take 2 tablets (440 mg total) by mouth 2 (two) times daily. 06/06/15 06/13/15 Yes Gerhard Munch, MD  ondansetron (  ZOFRAN ODT) 4 MG disintegrating tablet Take 1 tablet (4 mg total) by mouth every 8 (eight) hours as needed for nausea. 06/02/15  Yes Rolland PorterMark James, MD     Review of Systems  Positive ROS: As above  All other systems have been reviewed and were otherwise negative with the exception of those mentioned in the HPI and as above.  Objective: Vital signs in last 24 hours: Temp:  [98.1 F (36.7 C)] 98.1 F (36.7 C) (06/29 1249) Pulse Rate:  [75] 75 (06/29 1249) Resp:  [16] 16 (06/29 1249) BP: (166)/(70) 166/70 mmHg (06/29 1249) SpO2:  [100 %] 100 % (06/29 1249) Weight:  [63.957 kg (141 lb)] 63.957 kg (141 lb) (06/29 1249)  General Appearance: Alert, cooperative, no distress, Head: Normocephalic, without obvious abnormality, atraumatic Eyes: PERRL,  conjunctiva/corneas clear, EOM's intact,    Ears: Normal  Throat: Normal  Neck: Supple, symmetrical, trachea midline, no adenopathy; thyroid: No enlargement/tenderness/nodules; no carotid bruit or JVD Back: Symmetric, no curvature, ROM normal, no CVA tenderness Lungs: Clear to auscultation bilaterally, respirations unlabored Heart: Regular rate and rhythm, no murmur, rub or gallop Abdomen: Soft, non-tender,, no masses, no organomegaly Extremities: Extremities normal, atraumatic, no cyanosis or edema Pulses: 2+ and symmetric all extremities Skin: Skin color, texture, turgor normal, no rashes or lesions  NEUROLOGIC:   Mental status: alert and oriented, no aphasia, good attention span, Fund of knowledge/ memory ok Motor Exam - grossly normal Sensory Exam - grossly normal Reflexes:  Coordination - grossly normal Gait - grossly normal Balance - grossly normal Cranial Nerves: I: smell Not tested  II: visual acuity  OS: Normal  OD: Normal   II: visual fields Full to confrontation  II: pupils Equal, round, reactive to light  III,VII: ptosis None  III,IV,VI: extraocular muscles  Full ROM  V: mastication Normal  V: facial light touch sensation  Normal  V,VII: corneal reflex  Present  VII: facial muscle function - upper  Normal  VII: facial muscle function - lower Normal  VIII: hearing Not tested  IX: soft palate elevation  Normal  IX,X: gag reflex Present  XI: trapezius strength  5/5  XI: sternocleidomastoid strength 5/5  XI: neck flexion strength  5/5  XII: tongue strength  Normal    Data Review Lab Results  Component Value Date   WBC 9.0 06/02/2015   HGB 11.8* 06/02/2015   HCT 35.4* 06/02/2015   MCV 88.7 06/02/2015   PLT 198 06/02/2015   Lab Results  Component Value Date   NA 137 06/06/2015   K 4.0 06/06/2015   CL 99* 06/06/2015   CO2 28 06/06/2015   BUN 17 06/06/2015   CREATININE 0.81 06/06/2015   GLUCOSE 124* 06/06/2015   No results found for: INR,  PROTIME  Assessment/Plan: Left L2-3 far lateral herniated disc, lumbago, lumbar radiculopathy Olen I discussed the situation with the patient and her husband. I have reviewed her MRI scan with him and pointed out the abnormalities. We have discussed the various treatment options including surgery. I described the surgical treatment option of a left L2-3 discectomy. I have shown them surgical models. We have discussed the risks, benefits, alternatives, and likelihood of achieving our goals with surgery. I have answered all the patient's, and her husbands, questions. She has decided to proceed with surgery.   Deara Bober D 06/09/2015 4:26 PM

## 2015-06-09 NOTE — Anesthesia Procedure Notes (Signed)
Procedure Name: Intubation Date/Time: 06/09/2015 5:14 PM Performed by: Eligha Bridegroom Pre-anesthesia Checklist: Patient being monitored, Suction available, Emergency Drugs available, Patient identified and Timeout performed Patient Re-evaluated:Patient Re-evaluated prior to inductionOxygen Delivery Method: Circle system utilized Preoxygenation: Pre-oxygenation with 100% oxygen Intubation Type: IV induction Ventilation: Mask ventilation without difficulty and Oral airway inserted - appropriate to patient size Laryngoscope Size: Mac and 3 Grade View: Grade II Tube size: 7.0 mm Number of attempts: 1 Airway Equipment and Method: Stylet and LTA kit utilized Placement Confirmation: ETT inserted through vocal cords under direct vision,  positive ETCO2 and breath sounds checked- equal and bilateral Secured at: 21 cm Tube secured with: Tape Dental Injury: Teeth and Oropharynx as per pre-operative assessment

## 2015-06-09 NOTE — Anesthesia Preprocedure Evaluation (Addendum)
Anesthesia Evaluation  Patient identified by MRN, date of birth, ID band Patient awake    Reviewed: Allergy & Precautions, NPO status , Patient's Chart, lab work & pertinent test results, reviewed documented beta blocker date and time   History of Anesthesia Complications (+) PONV  Airway Mallampati: II  TM Distance: >3 FB Neck ROM: Full    Dental no notable dental hx. (+) Teeth Intact   Pulmonary neg pulmonary ROS, former smoker,  breath sounds clear to auscultation  Pulmonary exam normal       Cardiovascular Normal cardiovascular exam+ dysrhythmias Rhythm:Regular Rate:Normal     Neuro/Psych Anxiety Chronic dilaudid use for pain negative neurological ROS     GI/Hepatic negative GI ROS, Neg liver ROS,   Endo/Other  Hypothyroidism   Renal/GU negative Renal ROS  negative genitourinary   Musculoskeletal negative musculoskeletal ROS (+)   Abdominal   Peds negative pediatric ROS (+)  Hematology  (+) anemia ,   Anesthesia Other Findings   Reproductive/Obstetrics negative OB ROS                            Anesthesia Physical Anesthesia Plan  ASA: II  Anesthesia Plan: General   Post-op Pain Management:    Induction: Intravenous  Airway Management Planned: Oral ETT  Additional Equipment:   Intra-op Plan:   Post-operative Plan: Extubation in OR  Informed Consent: I have reviewed the patients History and Physical, chart, labs and discussed the procedure including the risks, benefits and alternatives for the proposed anesthesia with the patient or authorized representative who has indicated his/her understanding and acceptance.   Dental advisory given  Plan Discussed with: CRNA and Surgeon  Anesthesia Plan Comments:        Anesthesia Quick Evaluation

## 2015-06-10 ENCOUNTER — Encounter (HOSPITAL_COMMUNITY): Payer: Self-pay | Admitting: Neurosurgery

## 2015-06-10 DIAGNOSIS — M5116 Intervertebral disc disorders with radiculopathy, lumbar region: Secondary | ICD-10-CM | POA: Diagnosis not present

## 2015-06-10 MED ORDER — KETOROLAC TROMETHAMINE 0.5 % OP SOLN
1.0000 [drp] | Freq: Four times a day (QID) | OPHTHALMIC | Status: DC | PRN
Start: 2015-06-10 — End: 2015-06-10
  Filled 2015-06-10: qty 3

## 2015-06-10 MED ORDER — BSS IO SOLN
15.0000 mL | Freq: Once | INTRAOCULAR | Status: AC
  Administered 2015-06-10: 15 mL
  Filled 2015-06-10: qty 15

## 2015-06-10 MED ORDER — KETOROLAC TROMETHAMINE 0.5 % OP SOLN
1.0000 [drp] | Freq: Three times a day (TID) | OPHTHALMIC | Status: DC | PRN
  Administered 2015-06-10: 1 [drp] via OPHTHALMIC
  Filled 2015-06-10: qty 3

## 2015-06-10 MED ORDER — HYDROMORPHONE HCL 2 MG PO TABS: 1.0000 mg | ORAL_TABLET | ORAL | Status: DC | PRN

## 2015-06-10 NOTE — Discharge Summary (Signed)
Physician Discharge Summary  Patient ID: Jean Jennings MRN: [id number] DOB/AGE: 76/04/1941 76 y.o.  Admit date: 06/09/2015 Discharge date: 06/10/2015  Admission Diagnoses: lumbar HNP    Discharge Diagnoses: same   Discharged Condition: good  Hospital Course: The patient was admitted on 06/09/2015 and taken to the operating room where the patient underwent microdiskectomy. The patient tolerated the procedure well and was taken to the recovery room and then to the floor in stable condition. The hospital course was routine. There were no complications. The wound remained clean dry and intact. Pt had appropriate back soreness. No complaints of leg pain or new N/T/W. The patient remained afebrile with stable vital signs, and tolerated a regular diet. The patient continued to increase activities, and pain was well controlled with oral pain medications.   Consults: None  Significant Diagnostic Studies:  Results for orders placed or performed during the hospital encounter of 06/09/15  Surgical pcr screen  Result Value Ref Range   MRSA, PCR NEGATIVE NEGATIVE   Staphylococcus aureus NEGATIVE NEGATIVE    Dg Lumbar Spine 2-3 Views  06/09/2015   CLINICAL DATA:  L2-3 laminectomy and decompression.  EXAM: LUMBAR SPINE - 2-3 VIEW  COMPARISON:  Lumbar MRI 06/01/2005)  FINDINGS: Three cross-table lateral views of the lumbar spine are submitted from the operating room. Image number 1 demonstrates metallic instruments overlying the L2 and L3 vertebral bodies. Image number 2 demonstrates a surgical instrument directed towards the posterior aspect of the L1-2 disc space. The third and final image demonstrates skin spreaders posteriorly at L2 with a surgical instrument directed towards the L2-3 foramina.  IMPRESSION: Intraoperative localization as described.   Electronically Signed   By: Carey Bullocks M.D.   On: 06/09/2015 18:30   Mr Lumbar Spine Wo Contrast  06/02/2015   CLINICAL DATA:  Low back pain  beginning today. Pain extends into the left lower abdomen.  EXAM: MRI LUMBAR SPINE WITHOUT CONTRAST  TECHNIQUE: Multiplanar, multisequence MR imaging of the lumbar spine was performed. No intravenous contrast was administered.  COMPARISON:  CT abdomen and pelvis of the same day.  FINDINGS: Normal signal is present in the conus medullaris which terminates at L1-2. Marrow signal, vertebral body heights, alignment are normal. Leftward curvature lumbar spine is centered at L4. Limited imaging of the abdomen is unremarkable.  L1-2: A mild leftward disc protrusion is present without significant stenosis.  L2-3: Moderate facet hypertrophy is present bilaterally. A broad-based disc protrusion is noted. There is some distortion of the central canal without significant stenosis.  L3-4: A broad-based disc protrusion is present. Moderate facet hypertrophy is noted bilaterally. Mild subarticular narrowing is present bilaterally. The disc extends into both neural foramina without significant stenosis.  L4-5: A broad-based disc protrusion is present. Moderate facet hypertrophy and short pedicles are noted. This results in moderate subarticular stenosis bilaterally. Mild to moderate right and mild left foraminal stenosis is present.  L5-S1: A broad-based disc protrusion is present. Mild facet hypertrophy is noted bilaterally. Moderate subarticular and foraminal stenosis bilaterally is worse on the right.  IMPRESSION: 1. Multilevel spondylosis of the lumbar spine as described. 2. Moderate facet hypertrophy and broad-based disc protrusion at L2-3 without significant stenosis. 3. Mild subarticular narrowing at L3-4 bilaterally. 4. Moderate subarticular stenosis bilaterally and mild to moderate foraminal stenosis at L4-5, worse on the right. 5. Moderate subarticular and foraminal stenosis at L5-S1 is worse on the right.   Electronically Signed   By: Marin Roberts M.D.   On: 06/02/2015 18:52  Ct Renal Stone Study  06/02/2015    CLINICAL DATA:  Left lower back pain, status post hysterectomy  EXAM: CT ABDOMEN AND PELVIS WITHOUT CONTRAST  TECHNIQUE: Multidetector CT imaging of the abdomen and pelvis was performed following the standard protocol without IV contrast.  COMPARISON:  Abdominal ultrasound 01/29/2015  FINDINGS: Sagittal images of the spine shows degenerative changes lumbar spine. Moderate anterior spurring upper endplate of L3 and L4 vertebral body. Significant disc space flattening with vacuum disc phenomenon at L4-L5 and L5-S1 level.  The lung bases are unremarkable.  Unenhanced liver shows no biliary ductal dilatation. Unenhanced pancreas, spleen and adrenal glands are unremarkable. Unenhanced kidneys are symmetrical in size. No hydronephrosis or hydroureter. No nephrolithiasis. No calcified ureteral calculi are noted bilaterally.  There is no small bowel obstruction. No ascites or free air. No adenopathy. No pericecal inflammation. The terminal ileum is unremarkable.  The patient is status post hysterectomy. Bilateral distal ureter is not dilated. Urinary bladder is under distended. Nonspecific mild thickening of urinary bladder wall. Bilateral inguinal canal tiny hernia containing fat without evidence of acute complication.  IMPRESSION: 1. There is no nephrolithiasis.  No hydronephrosis or hydroureter. 2. No calcified ureteral calculi are noted. 3. No pericecal inflammation.  No small bowel obstruction. 4. Under distended urinary bladder. Nonspecific mild thickening of urinary bladder wall. 5. Unremarkable distal ureter bilaterally. Status post hysterectomy.   Electronically Signed   By: Natasha MeadLiviu  Pop M.D.   On: 06/02/2015 17:33    Antibiotics:  Anti-infectives    Start     Dose/Rate Route Frequency Ordered Stop   06/10/15 0100  ceFAZolin (ANCEF) IVPB 2 g/50 mL premix     2 g 100 mL/hr over 30 Minutes Intravenous Every 8 hours 06/09/15 2152 06/10/15 1659   06/09/15 1809  bacitracin 50,000 Units in sodium chloride  irrigation 0.9 % 500 mL irrigation  Status:  Discontinued       As needed 06/09/15 1810 06/09/15 1915      Discharge Exam: Blood pressure 114/52, pulse 74, temperature 97.9 F (36.6 C), temperature source Oral, resp. rate 16, height 5\' 4"  (1.626 m), weight 141 lb (63.957 kg), SpO2 97 %. Neurologic: Grossly normal Incision CDI  Discharge Medications:     Medication List    STOP taking these medications        methylPREDNISolone 4 MG Tbpk tablet  Commonly known as:  MEDROL DOSEPAK      TAKE these medications        acetaminophen 500 MG tablet  Commonly known as:  TYLENOL  Take 500-1,000 mg by mouth every 6 (six) hours as needed for headache (headache).     citalopram 10 MG tablet  Commonly known as:  CELEXA  Take 5-10 mg by mouth every morning. Takes 5 mg one day and 10 mg  Next day alternates dosages     estrogens (conjugated) 0.45 MG tablet  Commonly known as:  PREMARIN  Take 0.45 mg by mouth daily.     HYDROmorphone 2 MG tablet  Commonly known as:  DILAUDID  Take 0.5 tablets (1 mg total) by mouth every 4 (four) hours as needed for severe pain.     levothyroxine 112 MCG tablet  Commonly known as:  SYNTHROID, LEVOTHROID  Take 112 mcg by mouth daily before breakfast.     metoprolol 50 MG tablet  Commonly known as:  LOPRESSOR  Take 50 mg by mouth at bedtime.     naproxen sodium 220 MG tablet  Commonly known as:  ANAPROX  Take 2 tablets (440 mg total) by mouth 2 (two) times daily.     ondansetron 4 MG disintegrating tablet  Commonly known as:  ZOFRAN ODT  Take 1 tablet (4 mg total) by mouth every 8 (eight) hours as needed for nausea.     Vitamin D 2000 UNITS Caps  Take 1 capsule by mouth every evening.        Disposition: home   Final Dx: L2-3 microdiskectomy      Discharge Instructions     Remove dressing in 72 hours    Complete by:  As directed      Call MD for:  difficulty breathing, headache or visual disturbances    Complete by:  As directed       Call MD for:  persistant nausea and vomiting    Complete by:  As directed      Call MD for:  redness, tenderness, or signs of infection (pain, swelling, redness, odor or green/yellow discharge around incision site)    Complete by:  As directed      Call MD for:  severe uncontrolled pain    Complete by:  As directed      Call MD for:  temperature >100.4    Complete by:  As directed      Diet - low sodium heart healthy    Complete by:  As directed      Discharge instructions    Complete by:  As directed   No bending or twisting, no heavy lifting, may shower     Increase activity slowly    Complete by:  As directed            Follow-up Information    Follow up with Cristi Loron, MD. Schedule an appointment as soon as possible for a visit in 2 weeks.   Specialty:  Neurosurgery   Contact information:   [address] [place] [address] [phone]        Signed: Tia Alert 06/10/2015, 7:27 AM

## 2015-06-10 NOTE — Evaluation (Signed)
Physical Therapy Evaluation Patient Details Name: Jean BostonJane C Jennings MRN: [id number] DOB: June 10, 1941 Today's Date: 06/10/2015   History of Present Illness  Pt is a 74 y/o female admitted s/p elective L L2-3 intervertebral discectomy using micro-dissection on 06/09/15.  Clinical Impression  Patient evaluated by Physical Therapy with no further acute PT needs identified. All education has been completed and the patient has no further questions. At the time of PT eval pt was able to perform transfers and ambulation without physical assistance. Overall at a supervision/Mod I level for mobility and will have husband present 24 hours at home. Main limiting factor during session was L eye discomfort from possible cornea scratch. See below for any follow-up Physial Therapy or equipment needs. PT is signing off. Thank you for this referral.     Follow Up Recommendations No PT follow up    Equipment Recommendations  None recommended by PT    Recommendations for Other Services       Precautions / Restrictions Precautions Precautions: Fall Restrictions Weight Bearing Restrictions: No      Mobility  Bed Mobility Overal bed mobility: Needs Assistance Bed Mobility: Rolling;Sidelying to Sit Rolling: Supervision Sidelying to sit: Supervision       General bed mobility comments: VC's for sequencing and technique for log roll technique.   Transfers Overall transfer level: Modified independent Equipment used: None             General transfer comment: No physical assistance required.   Ambulation/Gait Ambulation/Gait assistance: Supervision Ambulation Distance (Feet): 300 Feet Assistive device: None Gait Pattern/deviations: Step-through pattern;Decreased stride length Gait velocity: Decreased Gait velocity interpretation: Below normal speed for age/gender General Gait Details: No physical assistance required. Pt was able to ambulate a good distance with 1 standing rest break due to eye  discomfort.   Stairs Stairs: Yes Stairs assistance: Supervision Stair Management: One rail Left Number of Stairs: 4 General stair comments: Pt was instructed in sequencing and step-to pattern for initial stair negotiation. No physical assistance required.   Wheelchair Mobility    Modified Rankin (Stroke Patients Only)       Balance Overall balance assessment: No apparent balance deficits (not formally assessed)                                           Pertinent Vitals/Pain Pain Assessment: Faces Faces Pain Scale: Hurts a little bit Pain Location: L eye - possible cornea scratch  Pain Descriptors / Indicators: Sharp Pain Intervention(s): Limited activity within patient's tolerance;Monitored during session;Repositioned    Home Living Family/patient expects to be discharged to:: Private residence Living Arrangements: Spouse/significant other Available Help at Discharge: Family;Available 24 hours/day Type of Home: House Home Access: Stairs to enter Entrance Stairs-Rails: Right;Left Entrance Stairs-Number of Steps: 3 through the garage Home Layout: Two level;Able to live on main level with bedroom/bathroom Home Equipment: Gilmer MorCane - single point;Shower seat - built in      Prior Function Level of Independence: Independent         Comments: Pt's symptoms came on within the last week but was still able to perform ADL's independently     Hand Dominance   Dominant Hand: Right    Extremity/Trunk Assessment   Upper Extremity Assessment: Defer to OT evaluation           Lower Extremity Assessment: LLE deficits/detail   LLE Deficits / Details: Decreased strength  from pain prior to surgery  Cervical / Trunk Assessment: Normal  Communication   Communication: No difficulties  Cognition Arousal/Alertness: Awake/alert Behavior During Therapy: WFL for tasks assessed/performed Overall Cognitive Status: Within Functional Limits for tasks assessed                       General Comments      Exercises        Assessment/Plan    PT Assessment Patent does not need any further PT services  PT Diagnosis Acute pain   PT Problem List    PT Treatment Interventions     PT Goals (Current goals can be found in the Care Plan section) Acute Rehab PT Goals PT Goal Formulation: All assessment and education complete, DC therapy    Frequency     Barriers to discharge        Co-evaluation               End of Session Equipment Utilized During Treatment:  (None) Activity Tolerance: Patient tolerated treatment well Patient left: in chair;with call bell/phone within reach Nurse Communication: Mobility status    Functional Assessment Tool Used: Clinical judgement Functional Limitation: Mobility: Walking and moving around Mobility: Walking and Moving Around Current Status (N5621): At least 1 percent but less than 20 percent impaired, limited or restricted Mobility: Walking and Moving Around Goal Status [phone]): At least 1 percent but less than 20 percent impaired, limited or restricted Mobility: Walking and Moving Around Discharge Status [phone]): At least 1 percent but less than 20 percent impaired, limited or restricted    Time: 0745-0813 PT Time Calculation (min) (ACUTE ONLY): 28 min   Charges:   PT Evaluation $Initial PT Evaluation Tier I: 1 Procedure PT Treatments $Gait Training: 8-22 mins   PT G Codes:   PT G-Codes **NOT FOR INPATIENT CLASS** Functional Assessment Tool Used: Clinical judgement Functional Limitation: Mobility: Walking and moving around Mobility: Walking and Moving Around Current Status (G2952): At least 1 percent but less than 20 percent impaired, limited or restricted Mobility: Walking and Moving Around Goal Status [phone]): At least 1 percent but less than 20 percent impaired, limited or restricted Mobility: Walking and Moving Around Discharge Status [phone]): At least 1 percent but less than 20  percent impaired, limited or restricted    Conni Slipper 06/10/2015, 8:25 AM   Conni Slipper, PT, DPT Acute Rehabilitation Services Pager: [phone]

## 2015-06-10 NOTE — Addendum Note (Signed)
Addendum  created 06/10/15 [id number] by Eilene GhaziGeorge Kaia Depaolis, MD   Modules edited: Orders, PRL Based Order Sets

## 2015-06-11 NOTE — Progress Notes (Signed)
Patient alert and oriented, mae's well, voiding adequate amount of urine, swallowing without difficulty, no c/o pain. Patient discharged home with family. Script and discharged instructions given to patient. Patient and family stated understanding of d/c instructions given and has an appointment with MD. 

## 2016-05-10 ENCOUNTER — Other Ambulatory Visit: Payer: Self-pay | Admitting: Internal Medicine

## 2016-05-10 DIAGNOSIS — Z1231 Encounter for screening mammogram for malignant neoplasm of breast: Secondary | ICD-10-CM

## 2016-05-26 ENCOUNTER — Ambulatory Visit: Payer: Medicare Other

## 2016-06-19 ENCOUNTER — Ambulatory Visit
Admission: RE | Admit: 2016-06-19 | Discharge: 2016-06-19 | Disposition: A | Discharge status: Home / Self Care | Payer: Federal, State, Local not specified - PPO | Source: Ambulatory Visit | Attending: Internal Medicine | Admitting: Internal Medicine

## 2016-06-19 DIAGNOSIS — Z1231 Encounter for screening mammogram for malignant neoplasm of breast: Secondary | ICD-10-CM

## 2016-06-29 ENCOUNTER — Ambulatory Visit (INDEPENDENT_AMBULATORY_CARE_PROVIDER_SITE_OTHER): Payer: Federal, State, Local not specified - PPO | Admitting: Gastroenterology

## 2016-06-29 ENCOUNTER — Encounter: Payer: Self-pay | Admitting: Gastroenterology

## 2016-06-29 VITALS — BP 124/78 | HR 80 | Ht 64.0 in | Wt 145.0 lb

## 2016-06-29 DIAGNOSIS — K589 Irritable bowel syndrome without diarrhea: Secondary | ICD-10-CM | POA: Diagnosis not present

## 2016-06-29 DIAGNOSIS — R197 Diarrhea, unspecified: Secondary | ICD-10-CM | POA: Diagnosis not present

## 2016-06-29 NOTE — Patient Instructions (Signed)
Use VSL #3 112 BU Twice a day for 1 month Low FODMAP diet given Strictly follow this diet for two weeks and then add one group every week  Follow up in 3 months

## 2016-07-10 NOTE — Progress Notes (Signed)
Jean Jennings    [id number]    May 17, 1941  Primary Care Physician:HOLWERDA, Lorin Picket, MD  Referring Physician: Kristie Cowman, MD [address] [place], [place] [address]  Chief complaint:  Diarrhea, irritable bowel syndrome  HPI:  75 year old female with hypothyroidism previously followed by Dr. Laural Benes at [facility] gastroenterology is here for second opinion and management of irritable bowel syndrome. He has had extensive chew at evaluation including colonoscopy with biopsies from colon that were negative for  microscopic colitis. Negative celiac panel . Her main complaint is diarrhea with liquid to semi-formed stool, to 3 episodes in the morning and she never usually have any nocturnal episodes. She also has history of chronic GERD. She and is concerned about her persistent symptoms and is wondering if she would improve. Denies any nausea, vomiting, abdominal pain, melena or bright red blood per rectum    Outpatient Encounter Prescriptions as of 06/29/2016  Medication Sig  . Cholecalciferol (VITAMIN D) 2000 UNITS CAPS Take 1 capsule by mouth every evening.   . citalopram (CELEXA) 10 MG tablet Take 5-10 mg by mouth every morning. Takes 5 mg one day and 10 mg  Next day alternates dosages  . estrogens, conjugated, (PREMARIN) 0.45 MG tablet Take 0.45 mg by mouth daily.   Marland Kitchen levothyroxine (SYNTHROID, LEVOTHROID) 112 MCG tablet Take 112 mcg by mouth daily before breakfast.  . metoprolol (LOPRESSOR) 50 MG tablet Take 50 mg by mouth at bedtime.  . [DISCONTINUED] acetaminophen (TYLENOL) 500 MG tablet Take 500-1,000 mg by mouth every 6 (six) hours as needed for headache (headache).   . [DISCONTINUED] HYDROmorphone (DILAUDID) 2 MG tablet Take 0.5 tablets (1 mg total) by mouth every 4 (four) hours as needed for severe pain.  . [DISCONTINUED] ondansetron (ZOFRAN ODT) 4 MG disintegrating tablet Take 1 tablet (4 mg total) by mouth every 8 (eight) hours as needed for nausea.   No  facility-administered encounter medications on file as of 06/29/2016.     Allergies as of 06/29/2016 - Review Complete 06/29/2016  Allergen Reaction Noted  . Codeine Nausea And Vomiting 05/07/2013    Past Medical History:  Diagnosis Date  . Anemia    as a younger woman  . Anxiety   . Arthritis   . Diarrhea last 3 to 4 months   stomach pain  . Dysrhythmia 3-4 yrs ago   hx irregular heart beat, dr Earl Gala manages  . GERD (gastroesophageal reflux disease)   . Hypothyroidism   . IBS (irritable bowel syndrome)   . PONV (postoperative nausea and vomiting)    " a little bit"    Past Surgical History:  Procedure Laterality Date  . ABDOMINAL HYSTERECTOMY  1970's  . arthroscopic surgery  oct 2012   left knee  . COLONOSCOPY WITH PROPOFOL N/A 06/03/2013   Procedure: COLONOSCOPY WITH PROPOFOL;  Surgeon: Charolett Bumpers, MD;  Location: [facility];  Service: Endoscopy;  Laterality: N/A;  . left wriist ligament surgery  sev yrs ago  . LUMBAR LAMINECTOMY/DECOMPRESSION MICRODISCECTOMY Left 06/09/2015   Procedure: Left Lumbar two-three Microdiskectomy;  Surgeon: Tressie Stalker, MD;  Location: [facility];  Service: Neurosurgery;  Laterality: Left;  Left L2-3 Microdiskectomy  . thryoid surgery  1963   partial  . TONSILLECTOMY      Family History  Problem Relation Age of Onset  . Liver cancer Mother   . Colon cancer Mother   . Bone cancer Father     Social  History   Social History  . Marital status: Married    Spouse name: N/A  . Number of children: N/A  . Years of education: N/A   Occupational History  . Not on file.   Social History Main Topics  . Smoking status: Former Smoker    Packs/day: 0.25    Years: 3.00    Types: Cigarettes    Quit date: 12/11/1968  . Smokeless tobacco: Never Used  . Alcohol use Yes     Comment: occasional  . Drug use: No  . Sexual activity: Not on file   Other Topics Concern  . Not on file   Social History Narrative  . No narrative on  file      Review of systems: Review of Systems  Constitutional: Negative for fever and chills.  HENT: Negative.   Eyes: Negative for blurred vision.  Respiratory: Negative for cough, shortness of breath and wheezing.   Cardiovascular: Negative for chest pain and palpitations.  Gastrointestinal: as per HPI Genitourinary: Negative for dysuria, urgency, frequency and hematuria.  Musculoskeletal: Positive for myalgias, back pain and joint pain.  Skin: Negative for itching and rash.  Neurological: Negative for dizziness, tremors, focal weakness, seizures and loss of consciousness.  Endo/Heme/Allergies: Positive for environmental allergies.  Psychiatric/Behavioral: Negative for depression, suicidal ideas and hallucinations.  All other systems reviewed and are negative.   Physical Exam: Vitals:   06/29/16 0926  BP: 124/78  Pulse: 80   Gen:      No acute distress HEENT:  EOMI, sclera anicteric Neck:     No masses; no thyromegaly Lungs:    Clear to auscultation bilaterally; normal respiratory effort CV:         Regular rate and rhythm; no murmurs Abd:      + bowel sounds; soft, non-tender; no palpable masses, no distension Ext:    No edema; adequate peripheral perfusion Skin:      Warm and dry; no rash Neuro: alert and oriented x 3 Psych: normal mood and affect  Data Reviewed:  Reviewed chart in epic  Abdominal Ultrasound 01/29/15 Unremarkable and stable abdominal ultrasound. The liver is mildly heterogeneous by ultrasound, which appears similar to the prior scan. This may reflect mild underlying steatosis. No overt cirrhotic changes are identified by ultrasound.  Assessment and Plan/Recommendations: 75 year old female with history of irritable bowel syndrome predominant diarrhea is here to establish care  Discussed in detail regarding low FODMAP diet and avoiding lactose Start Probiotic VSL#3 112 billion units 1 capsule daily Return in 3 months  Greater than 50% of the  time used for counseling as well as treatment plan and follow-up. She had multiple questions which were answered to her satisfaction    K. Scherry Ran , MD [phone] Mon-Fri 8a-5p [phone] after 5p, weekends, holidays  CC: Kristie Cowman, MD

## 2016-09-04 IMAGING — MR MR LUMBAR SPINE W/O CM
4 of 5 series · 19 of 48 positions shown · non-contrast
Comparison: CT abdomen and pelvis of the same day.

CLINICAL DATA: Low back pain beginning today. Pain extends into the
left lower abdomen.

EXAM:
MRI LUMBAR SPINE WITHOUT CONTRAST
TECHNIQUE: Multiplanar, multisequence MR imaging of the lumbar spine was
performed. No intravenous contrast was administered.

[Series 3: T1 · sagittal · 4.0mm · 0.55mm/px · 3 of 15 slices shown (1 of 2)]
[im 3/15]
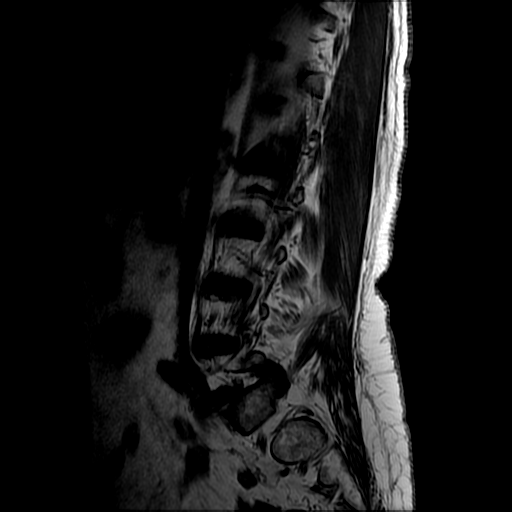
[im 9/15]
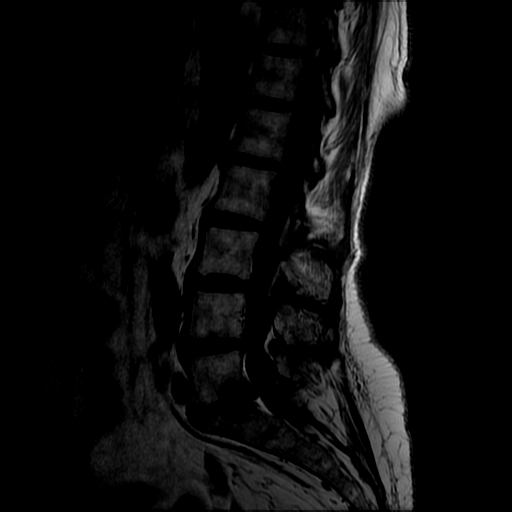
[im 15/15]
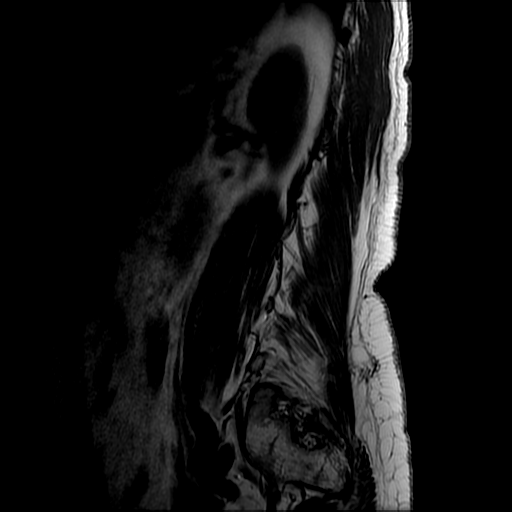

[Series 4: T2 post-contrast · sagittal · 4.0mm · 0.55mm/px · 6 of 15 slices shown]
[im 1/15]
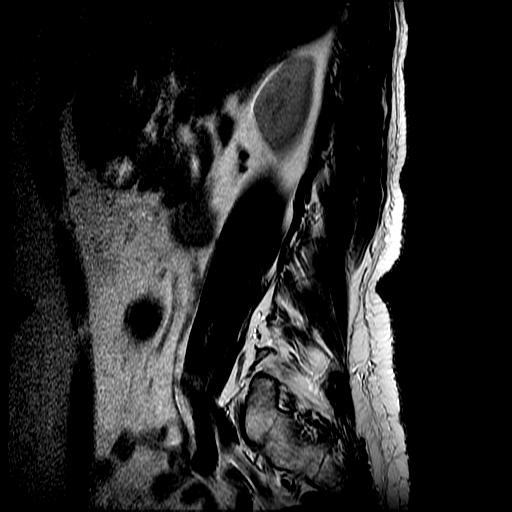
[im 3/15]
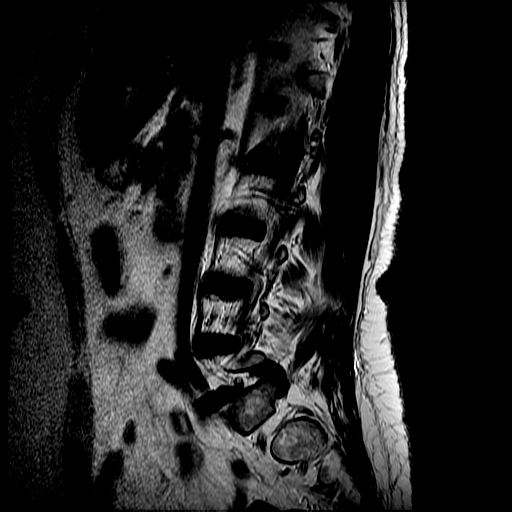
[im 6/15]
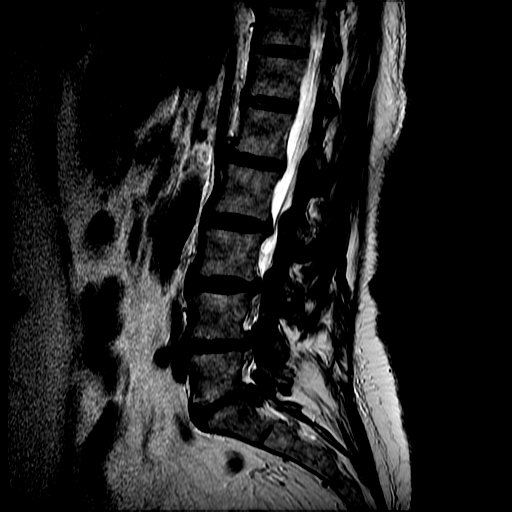
[im 9/15]
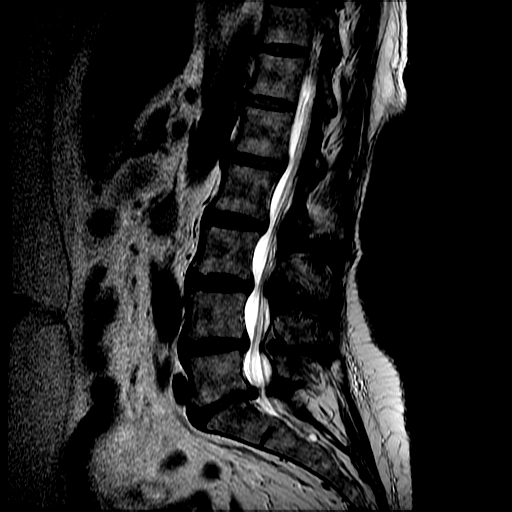
[im 12/15]
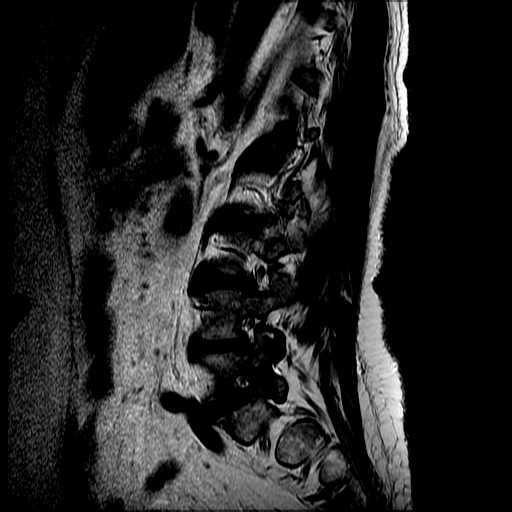
[im 15/15]
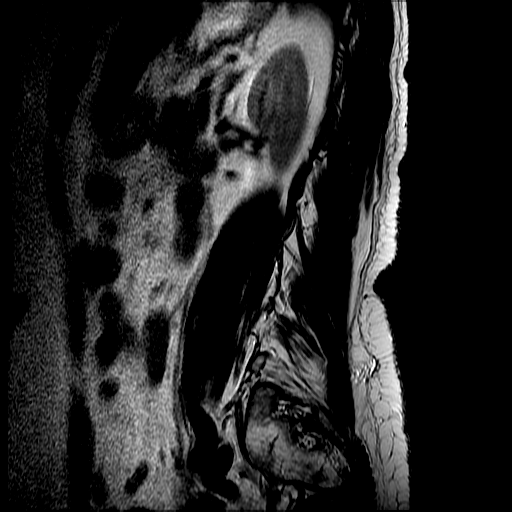

[Series 6: T2 · axial · 4.0mm · 0.39mm/px · z∈[-57,+121]mm · 7 of 38 slices shown]
[im 1/38]
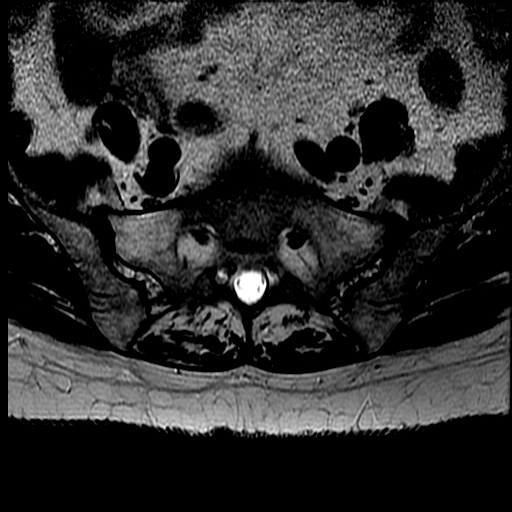
[im 6/38]
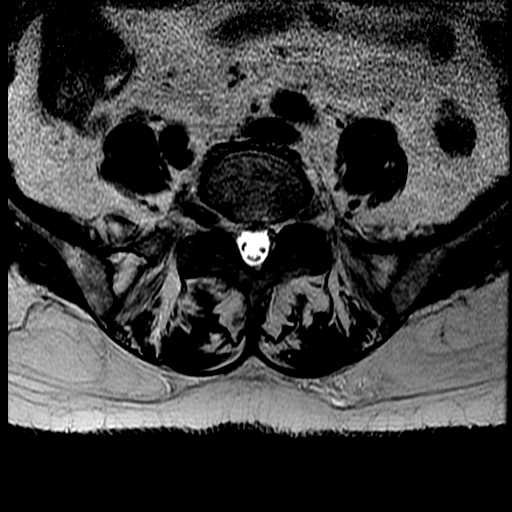
[im 11/38]
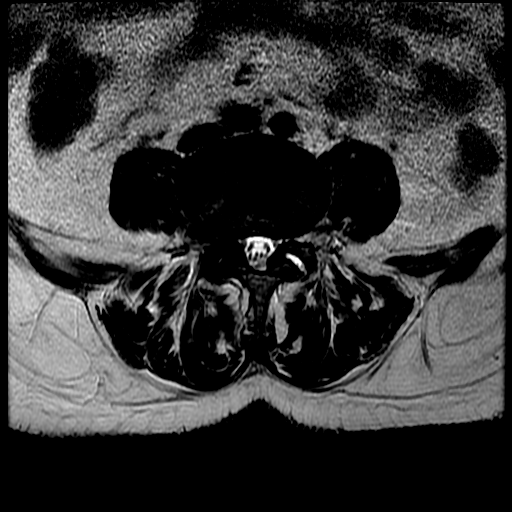
[im 16/38]
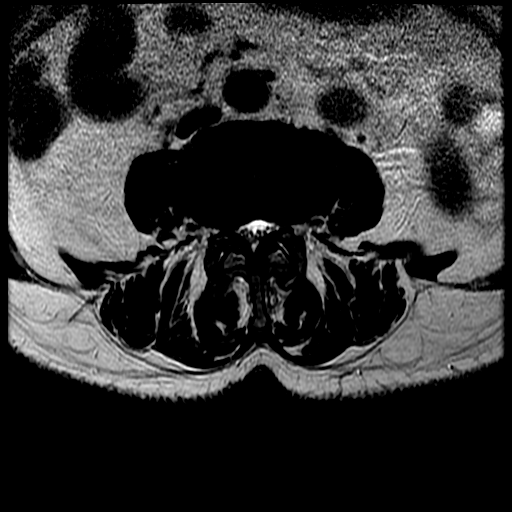
[im 19/38]
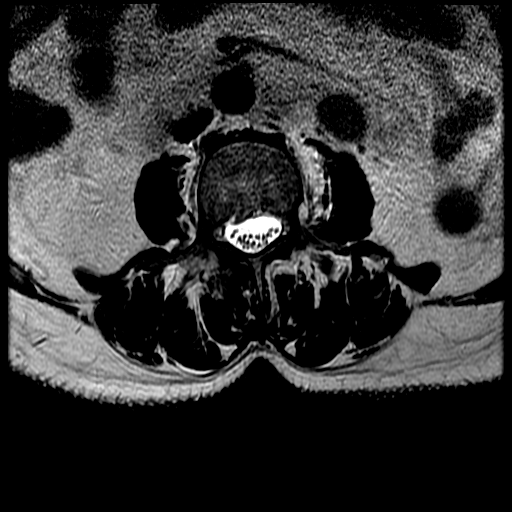
[im 22/38]
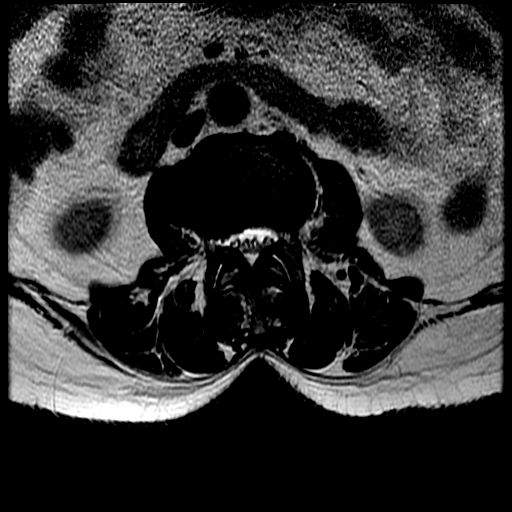
[im 32/38]
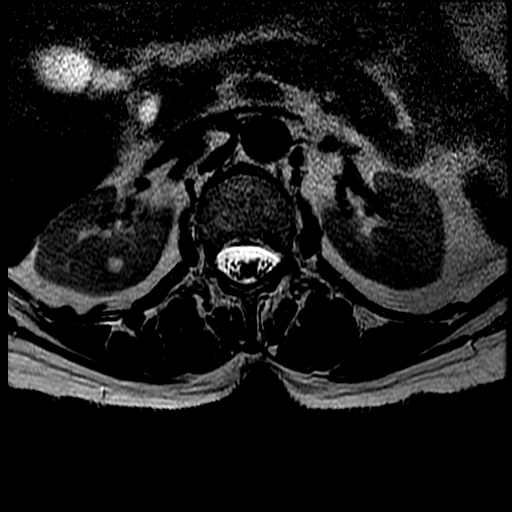

[Series 7: T1 · axial · 4.0mm · 0.39mm/px · z∈[-32,+121]mm · 3 of 38 slices shown (2 of 2)]
[im 6/38]
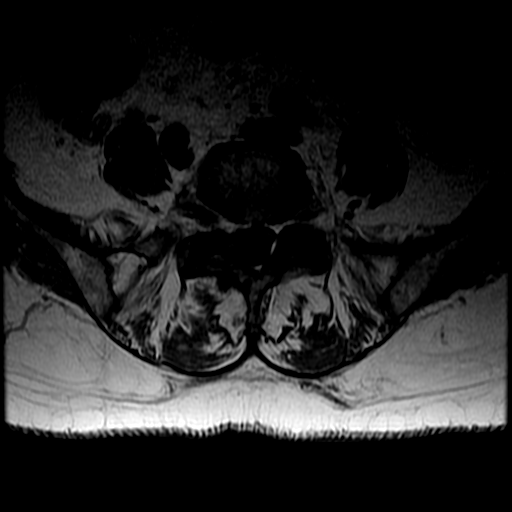
[im 19/38]
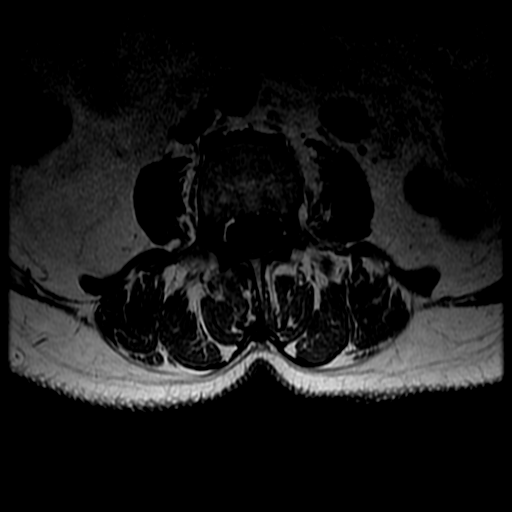
[im 32/38]
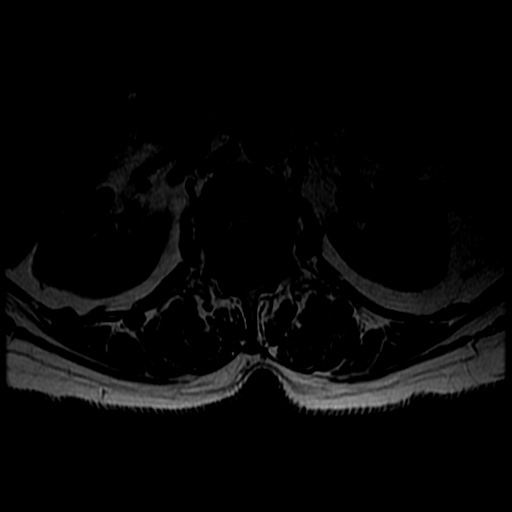

[19 of 48 positions shown; findings below may reference images not displayed]

FINDINGS: Normal signal is present in the conus medullaris which terminates at
L1-2. Marrow signal, vertebral body heights, alignment are normal.
Leftward curvature lumbar spine is centered at L4. Limited imaging
of the abdomen is unremarkable.

L1-2: A mild leftward disc protrusion is present without significant
stenosis.

L2-3: Moderate facet hypertrophy is present bilaterally. A
broad-based disc protrusion is noted. There is some distortion of
the central canal without significant stenosis.

L3-4: A broad-based disc protrusion is present. Moderate facet
hypertrophy is noted bilaterally. Mild subarticular narrowing is
present bilaterally. The disc extends into both neural foramina
without significant stenosis.

L4-5: A broad-based disc protrusion is present. Moderate facet
hypertrophy and short pedicles are noted. This results in moderate
subarticular stenosis bilaterally. Mild to moderate right and mild
left foraminal stenosis is present.

L5-S1: A broad-based disc protrusion is present. Mild facet
hypertrophy is noted bilaterally. Moderate subarticular and
foraminal stenosis bilaterally is worse on the right.
IMPRESSION: 1. Multilevel spondylosis of the lumbar spine as described.
2. Moderate facet hypertrophy and broad-based disc protrusion at
L2-3 without significant stenosis.
3. Mild subarticular narrowing at L3-4 bilaterally.
4. Moderate subarticular stenosis bilaterally and mild to moderate
foraminal stenosis at L4-5, worse on the right.
5. Moderate subarticular and foraminal stenosis at L5-S1 is worse on
the right.

## 2016-11-17 DIAGNOSIS — M19041 Primary osteoarthritis, right hand: Secondary | ICD-10-CM | POA: Insufficient documentation

## 2017-05-11 ENCOUNTER — Other Ambulatory Visit: Payer: Self-pay | Admitting: Internal Medicine

## 2017-05-11 DIAGNOSIS — Z1231 Encounter for screening mammogram for malignant neoplasm of breast: Secondary | ICD-10-CM

## 2017-06-20 ENCOUNTER — Ambulatory Visit
Admission: RE | Admit: 2017-06-20 | Discharge: 2017-06-20 | Disposition: A | Discharge status: Home / Self Care | Payer: Federal, State, Local not specified - PPO | Source: Ambulatory Visit | Attending: Internal Medicine | Admitting: Internal Medicine

## 2017-06-20 DIAGNOSIS — Z1231 Encounter for screening mammogram for malignant neoplasm of breast: Secondary | ICD-10-CM

## 2017-08-21 ENCOUNTER — Encounter: Payer: Self-pay | Admitting: Physician Assistant

## 2017-08-21 ENCOUNTER — Telehealth: Payer: Self-pay | Admitting: *Deleted

## 2017-08-21 ENCOUNTER — Ambulatory Visit (INDEPENDENT_AMBULATORY_CARE_PROVIDER_SITE_OTHER): Payer: Federal, State, Local not specified - PPO | Admitting: Physician Assistant

## 2017-08-21 VITALS — BP 128/68 | HR 60 | Ht 63.0 in | Wt 149.0 lb

## 2017-08-21 DIAGNOSIS — K580 Irritable bowel syndrome with diarrhea: Secondary | ICD-10-CM

## 2017-08-21 MED ORDER — GLYCOPYRROLATE 2 MG PO TABS: 2.0000 mg | ORAL_TABLET | Freq: Two times a day (BID) | ORAL | 2 refills | Status: DC

## 2017-08-21 MED ORDER — VSL#3 PO CAPS: ORAL_CAPSULE | ORAL | 2 refills | Status: DC

## 2017-08-21 MED ORDER — RIFAXIMIN 550 MG PO TABS
550.0000 mg | ORAL_TABLET | Freq: Three times a day (TID) | ORAL | 0 refills | Status: AC
Start: 2017-08-21 — End: 2017-09-04

## 2017-08-21 NOTE — Telephone Encounter (Signed)
Today 08-21-2017, Sent electronically the Xifaxan 550 mg prescription  to  [facility]- I also faxed the office note and copies of insurance cards to fax  # [phone] .

## 2017-08-21 NOTE — Progress Notes (Signed)
Subjective:    Patient ID: Duane BostonJane C Garske, female    DOB: [birth year], 76 y.o.   MRN: [id number]  HPI Erskine SquibbJane is a very nice 76 year old white female, known to Dr. Lavon PaganiniNandigam who comes in today to discuss her IBS symptoms, in particular having issues with increased urgency and diarrhea. Patient is known to Dr. Lavon PaganiniNandigam it was last seen in the office in 2017. At that time she was given a low 5 mapped diet asked to completely avoid lactose and given VSL 3. Last colonoscopy was done in 2014 per Dr. Laural BenesJohnson for similar complaints of diarrhea. This was a negative exam and biopsies were done to rule out microscopic colitis also negative. Prior celiac markers have been negative as well. Patient says she has had diarrhea for as long as she can remember but feels that symptoms have been worse over the past 3-4 months. She says she definitely had improvement with a low Fodmap diet. She figured out several triggers and has continued to avoid these in general , and  continues to avoid lactose as well.Despite this,she has had more problems this past summer. She  had a bad episode while on vacation in June with urgency and incontinence which was humiliating.   She is anticipating a [place] to [place] in October for about 3 weeks and wants to know if there's anything that she can do to help control her symptoms. She says usually at least 1-2 times weekly she will have been an episode of severe urgency followed by completely liquid diarrhea. She may have several liquid bowel movements that day. She may have mild associated cramping and then feels fatigued for the rest of the day. After that she may not have a bowel movement for 3 or 4 days and have a normal bowel movement then go back to urgent diarrhea. He has not had any melena or hematochezia. Appetite  fine ,weight has been stable. Patient also avoiding artificial sweeteners and using Stevia.   No recent antibiotics or changes in medications.  Review of Systems Pertinent  positive and negative review of systems were noted in the above HPI section.  All other review of systems was otherwise negative.  Outpatient Encounter Prescriptions as of 08/21/2017  Medication Sig  . Cholecalciferol (VITAMIN D) 2000 UNITS CAPS Take 1 capsule by mouth every evening.   . citalopram (CELEXA) 10 MG tablet Take 5-10 mg by mouth every morning. Takes 5 mg one day and 10 mg  Next day alternates dosages  . levothyroxine (SYNTHROID, LEVOTHROID) 112 MCG tablet Take 112 mcg by mouth daily before breakfast.  . metoprolol (LOPRESSOR) 50 MG tablet Take 50 mg by mouth at bedtime.  Marland Kitchen. PREMARIN 0.3 MG tablet Take 1 tablet by mouth daily.  . propranolol (INDERAL) 40 MG tablet Take 1 tablet by mouth 2 (two) times daily.  Marland Kitchen. glycopyrrolate (ROBINUL) 2 MG tablet Take 1 tablet (2 mg total) by mouth 2 (two) times daily.  . Probiotic Product (VSL#3) CAPS Take 1 capsule twice daily for 4-6 weeks.  . rifaximin (XIFAXAN) 550 MG TABS tablet Take 1 tablet (550 mg total) by mouth 3 (three) times daily.  . [DISCONTINUED] estrogens, conjugated, (PREMARIN) 0.45 MG tablet Take 0.45 mg by mouth daily.    No facility-administered encounter medications on file as of 08/21/2017.    Allergies  Allergen Reactions  . Codeine Nausea And Vomiting    Lightheaded also   Patient Active Problem List   Diagnosis Date Noted  . Lumbar herniated disc  06/09/2015   Social History   Social History  . Marital status: Married    Spouse name: N/A  . Number of children: N/A  . Years of education: N/A   Occupational History  . Not on file.   Social History Main Topics  . Smoking status: Former Smoker    Packs/day: 0.25    Years: 3.00    Types: Cigarettes    Quit date: 12/11/1968  . Smokeless tobacco: Never Used  . Alcohol use Yes     Comment: occasional  . Drug use: No  . Sexual activity: Not on file   Other Topics Concern  . Not on file   Social History Narrative  . No narrative on file    Ms. Voytko's  family history includes Bone cancer in her father; Colon cancer in her mother; Liver cancer in her mother.      Objective:    Vitals:   08/21/17 1024  BP: 128/68  Pulse: 60    Physical Exam  well-developed older white female in no acute distress, very pleasant. [place] than stated age, blood pressure 128/68 pulse 60, height 5 foot 3, weight 149, BMI 26.3. HEENT; nontraumatic normocephalic EOMI PERRLA sclera anicteric, Cardiovascular ;regular rate and rhythm with S1-S2 no murmur or gallop, Pulmonary ;clear bilaterally, Abdomen ;soft nondistended bowel sounds are active there is no palpable mass or hepatosplenomegaly, Rectal; exam not done, Extremities; no clubbing cyanosis or edema skin warm and dry, Neuropsych; mood and affect appropriate       Assessment & Plan:   #94 76 year old white female with IBS-diarrhea predominant with exacerbation of urgency and episodic diarrhea over the past 3 months. Suspect she may have component of bacterial overgrowth #2 family history of colon cancer in patient's mother #3 colon cancer surveillance-last colonoscopy 2014 will be due for follow-up 2019  Plan; Patient will continue to follow a lower Fodmap type diet and avoid her known triggers. We'll give her a course of Xifaxan 550 mg by mouth 3 times a day 14 days Start Robinul Forte 2 mg by mouth twice a day when necessary, suggested she may want to take this at least once daily regularly during her travel After she completes the Xifaxan, restart VSL #3, 112 Billion /capsules 1 by mouth twice a day for 4-6 weeks and consider taking on a cyclic basis. She will be due for follow-up colonoscopy 2019.   Kitara Hebb S Deslyn Cavenaugh PA-C 08/21/2017   Cc: Alysia Penna, MD

## 2017-08-21 NOTE — Patient Instructions (Addendum)
We have sent the following medications to your pharmacy for you to pick up at your convenience:  [facility] [place] , [place]. Someone from [facility]  Will bel calling you.  We sent presccriptions also to [facility], [address]  1. Robinul Forte 2 mg 2. VLS # 3 probiotic  Call us back if this plan has not helped you.

## 2017-08-23 ENCOUNTER — Telehealth: Payer: Self-pay | Admitting: Physician Assistant

## 2017-08-23 NOTE — Telephone Encounter (Signed)
LM for the patient advising I did call [facility] and they are in the process of working on a prior authorization with her insurance company.  They will call her once they have that completed and have information on her co-pay.

## 2017-08-23 NOTE — Telephone Encounter (Signed)
Pt is returning your call

## 2017-08-24 NOTE — Telephone Encounter (Signed)
The patient wanted me to know she did get my message. A representaive from [facility] called her and askied her several questions.  I told her they have to do that because the insurance company will ask them questions .  I told her they will be getting back to her once they have completed the prior authorization with [facility].

## 2017-08-28 ENCOUNTER — Telehealth: Payer: Self-pay | Admitting: *Deleted

## 2017-08-28 NOTE — Progress Notes (Signed)
Reviewed and agree with documentation and assessment and plan. K. Veena Keysean Savino , MD   

## 2017-08-28 NOTE — Telephone Encounter (Signed)
Spoke to [employer], the prior authorization is pending. They will contact me when they get an answer from [facility].

## 2017-08-29 ENCOUNTER — Telehealth: Payer: Self-pay | Admitting: Physician Assistant

## 2017-08-29 NOTE — Telephone Encounter (Signed)
Called the patient and advised I have been talking to [facility] daily since Monday, 9-17.  Today I faxed them the [facility] form faxed to me from [facility].  I filled it out and was told to fax the form to Coralee North at [place], fax # [phone] I told the patient that I just put 5 days worth of samples of Xifaxan 550 mg at our front desk for her. She said she will come by and pick them up today.  Gave her instructions for the Robinul forte as well. Advised [facility] will be calling her sometime later this week. They should have an answer for this Prior Authorization.

## 2017-09-03 ENCOUNTER — Telehealth: Payer: Self-pay | Admitting: Physician Assistant

## 2017-09-03 NOTE — Telephone Encounter (Signed)
Patient states that she heard back from the pharmacy and cannot afford the $400 copay. She is requesting something less expensive.

## 2017-09-03 NOTE — Telephone Encounter (Signed)
Advised the patient that we have samples for the remaining 6 days she needs the Xifaxan.  days, 3 times daily is 18 tablets.  The patient is coming today to pick them up. She was very grateful. She said she cannot afford the $400.00 copay for the medication.

## 2017-09-07 NOTE — Telephone Encounter (Signed)
The patient told me also she cannot afford the $400.00 copay.  We gave her samples of the remainder of her treatment days.

## 2017-09-07 NOTE — Telephone Encounter (Signed)
Jean Jennings from [facility] states that copay for xifixan is $400 and patient is not willing to pay that. [phone]

## 2017-09-10 ENCOUNTER — Telehealth: Payer: Self-pay | Admitting: Gastroenterology

## 2017-09-10 NOTE — Telephone Encounter (Signed)
Completed course of Xifaxan. She did not feel better on the Xifaxan or since finishing it. She continues to have "urgent completely liquid stools" every 2 to 3 days. She will have a day or two of normal bowel movements. The days of urgency and diarrhea causes her to feel "wash out" and "very tired." Robinul makes her abdomen feel a little better and not as tender. She is going out of the country in 2 weeks.

## 2017-09-10 NOTE — Telephone Encounter (Signed)
She has been on VSL #3 since 08/21/17. She indicates she is unchanged from when she was seen despite these measures.

## 2017-09-10 NOTE — Telephone Encounter (Signed)
I would have her continue Robinul forte 2 mg po BID, restart VSL #3 112 Bill, twice daily, use Imodium as needed- she may want to take an Imodium of half an Imodium daily while on her trip

## 2017-09-11 ENCOUNTER — Other Ambulatory Visit: Payer: Self-pay

## 2017-09-11 ENCOUNTER — Encounter (INDEPENDENT_AMBULATORY_CARE_PROVIDER_SITE_OTHER): Payer: Self-pay

## 2017-09-11 ENCOUNTER — Other Ambulatory Visit: Payer: Federal, State, Local not specified - PPO

## 2017-09-11 DIAGNOSIS — R197 Diarrhea, unspecified: Secondary | ICD-10-CM

## 2017-09-11 DIAGNOSIS — R1013 Epigastric pain: Secondary | ICD-10-CM

## 2017-09-11 NOTE — Telephone Encounter (Signed)
Patient calling back regarding this. Best # [phone]

## 2017-09-11 NOTE — Telephone Encounter (Signed)
Patient agrees to testing.

## 2017-09-11 NOTE — Telephone Encounter (Signed)
Lets do GI path panel , stool for cdiff PCR, fecal elastase , and stool for lactoferrin .

## 2017-09-12 ENCOUNTER — Other Ambulatory Visit: Payer: Federal, State, Local not specified - PPO

## 2017-09-12 DIAGNOSIS — R197 Diarrhea, unspecified: Secondary | ICD-10-CM

## 2017-09-12 DIAGNOSIS — R1013 Epigastric pain: Secondary | ICD-10-CM

## 2017-09-13 LAB — CLOSTRIDIUM DIFFICILE BY PCR: Toxigenic C. Difficile by PCR: NOT DETECTED

## 2017-09-14 ENCOUNTER — Other Ambulatory Visit: Payer: Federal, State, Local not specified - PPO

## 2017-09-19 LAB — GASTROINTESTINAL PATHOGEN PANEL PCR
C. difficile Tox A/B, PCR: NOT DETECTED
Campylobacter, PCR: NOT DETECTED
Cryptosporidium, PCR: NOT DETECTED
E coli (ETEC) LT/ST PCR: NOT DETECTED
E coli (STEC) stx1/stx2, PCR: NOT DETECTED
E coli 0157, PCR: NOT DETECTED
Giardia lamblia, PCR: NOT DETECTED
Norovirus, PCR: NOT DETECTED
Rotavirus A, PCR: NOT DETECTED
Salmonella, PCR: NOT DETECTED
Shigella, PCR: NOT DETECTED

## 2017-09-19 LAB — FECAL LACTOFERRIN, QUANT
Fecal Lactoferrin: NEGATIVE
MICRO NUMBER:: 81099593
SPECIMEN QUALITY:: ADEQUATE

## 2017-09-19 LAB — PANCREATIC ELASTASE, FECAL: Pancreatic Elastase-1, Stool: >500 mcg/g

## 2017-10-11 ENCOUNTER — Telehealth: Payer: Self-pay | Admitting: Physician Assistant

## 2017-10-11 NOTE — Telephone Encounter (Signed)
Patient reports she has been 100% but has done fairly well on Robinul BID and Imodium PRN. However she does not feel this is sustainable due to the side effect of "dryness". There has not been any triggers through food that she can identify. Her question is what would the next step be?

## 2017-10-15 NOTE — Telephone Encounter (Signed)
Please call pt - continue low FODMAP diet -avoiding any category that triggers diarrhea, , restart VSL #3 112 billion - 2 twice daily, can try decreasing the Robinul to once daily to decrease side effects - unfortunately  all of the antispasmotics will have similar side effect. We gave her Xifaxan x 14 days - I dont believe she had response to that - if she did could repeat course.

## 2017-10-16 NOTE — Telephone Encounter (Signed)
Communicating via patient portal.

## 2017-11-27 ENCOUNTER — Encounter: Payer: Self-pay | Admitting: Physician Assistant

## 2017-11-27 ENCOUNTER — Ambulatory Visit: Payer: Federal, State, Local not specified - PPO | Admitting: Physician Assistant

## 2017-11-27 VITALS — BP 116/70 | HR 68 | Ht 64.0 in | Wt 150.0 lb

## 2017-11-27 DIAGNOSIS — K580 Irritable bowel syndrome with diarrhea: Secondary | ICD-10-CM | POA: Diagnosis not present

## 2017-11-27 NOTE — Patient Instructions (Addendum)
We have given you a trial of Viberzi. Take 1 tab twice daily.  Take with food.  Call us within a week and let us know if it is helping. If so, we can send a prescription.  You may use Imodium as needed. Follow a FODMAP diet.  If you are age 78 or older, your body mass index should be between 23-30. Your Body mass index is 25.75 kg/m. If this is out of the aforementioned range listed, please consider follow up with your Primary Care Provider.

## 2017-11-27 NOTE — Progress Notes (Signed)
Subjective:    Patient ID: Jean BostonJane C Jennings, female    DOB: 14-Aug-1941, 76 y.o.   MRN: [id number]  HPI Jean SquibbJane is a very nice 76 year old white female, known to Dr. Lavon PaganiniNandigam  and myself with history of long-standing IBS-diarrhea who has had an increase in episodes of urgency followed by multiple episodes of diarrhea.  She was last seen in the office in September 2018.  At that time she was started on  VSL 3, 112 billion twice daily.Marland Kitchen.  She was also given a course of Xifaxan 550 mg p.o. 3 times daily times 14 days.  Unfortunately she did not have any significant response to the Xifaxan.  She was also started on Robinul forte 2 mg p.o. twice daily.  She found that to be too drying and decrease to once daily which she has continued on.  She is using Imodium on a as needed basis.  She feels that both of these are helpful but have not resolved her symptoms.  She would very much like a "fix". She does have a low Fodmap diet at home and had done an elimination process previously.  She knows she is sensitive to lactose and also to beans.  She has continued to avoid these. We also did a GI path panel, stool for C. difficile PCR, fecal elastase and stool for lactoferrin at the time of her last office visit and all of these were negative. Last colonoscopy 2014 per Dr. Reece AgarMarty Johnson was negative including random biopsies for microscopic colitis.  Patient is also previously been tested for celiac disease. She says generally she may go for a couple of days and have soft to loose stools once or twice daily and will then have an episode of urgency cramping and diarrhea which can sometimes be associated with incontinence.  She says she will usually have several episodes of diarrhea that day and can be afraid to leave the house.  Review of Systems Pertinent positive and negative review of systems were noted in the above HPI section.  All other review of systems was otherwise negative.  Outpatient Encounter Medications as of  11/27/2017  Medication Sig  . Cholecalciferol (VITAMIN D) 2000 UNITS CAPS Take 1 capsule by mouth every evening.   . citalopram (CELEXA) 10 MG tablet Take 5-10 mg by mouth every morning. Takes 5 mg one day and 10 mg  Next day alternates dosages  . glycopyrrolate (ROBINUL) 2 MG tablet Take 1 tablet (2 mg total) by mouth 2 (two) times daily.  Marland Kitchen. levothyroxine (SYNTHROID, LEVOTHROID) 112 MCG tablet Take 112 mcg by mouth daily before breakfast.  . PREMARIN 0.3 MG tablet Take 1 tablet by mouth daily.  . Probiotic Product (VSL#3) CAPS Take 1 capsule twice daily for 4-6 weeks.  . propranolol (INDERAL) 40 MG tablet Take 1 tablet by mouth 2 (two) times daily.  . [DISCONTINUED] metoprolol (LOPRESSOR) 50 MG tablet Take 50 mg by mouth at bedtime.   No facility-administered encounter medications on file as of 11/27/2017.    Allergies  Allergen Reactions  . Codeine Nausea And Vomiting    Lightheaded also   Patient Active Problem List   Diagnosis Date Noted  . 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   Social History   Socioeconomic History  . Marital status: Married    Spouse name: Not on file  . Number of children: Not on file  . Years of education: Not on file  . Highest education level: Not on file  Social Needs  .  Financial resource strain: Not on file  . Food insecurity - worry: Not on file  . Food insecurity - inability: Not on file  . Transportation needs - medical: Not on file  . Transportation needs - non-medical: Not on file  Occupational History  . Not on file  Tobacco Use  . Smoking status: Former Smoker    Packs/day: 0.25    Years: 3.00    Pack years: 0.75    Types: Cigarettes    Last attempt to quit: 12/11/1968    Years since quitting: 48.9  . Smokeless tobacco: Never Used  Substance and Sexual Activity  . Alcohol use: Yes    Comment: occasional  . Drug use: No  . Sexual activity: Not on file  Other Topics Concern  . Not on file  Social History Narrative  . Not on file     Ms. Pacifico's family history includes Bone cancer in her father; Colon cancer in her mother; Liver cancer in her mother.      Objective:    Vitals:   11/27/17 0958  BP: 116/70  Pulse: 68    Physical Exam; well-developed elderly white female in no acute distress, very pleasant and appears younger than stated age blood pressure 116/70 pulse 68, height 5 foot 4, weight 150, BMI 25.7.  HEENT; nontraumatic normocephalic EOMI PERRLA sclera anicteric, Cardiovascular; regular rate and rhythm with S1-S2 no murmur rub or gallop, Pulmonary; clear bilaterally, Abdomen ;soft, nontender nondistended bowel sounds are active there is no palpable mass or hepatosplenomegaly, Rectal ;exam not done, Neuro psych; mood and affect appropriate       Assessment & Plan:   #481 76 year old white female with long-standing IBS-D with chronic soft to loose stools and intermittent episodes of urgency and diarrhea occasionally associated with incontinence.  She seems to cycle every few days. No response to previous course of Xifaxan  #2 hypertension #3.  Colon cancer surveillance-negative colonoscopy 2014  Plan; will start a trial of Viberzi 75 mg p.o. twice daily, patient was given enough samples to last for 2 weeks.  If she finds this helpful we will send a longer term prescription. Continue to avoid trigger foods i.e. lactose and lentils She may continue VSL 3 at current doses If Viberzi is ineffective we will plan to go back to Robinul Forte 1 p.o. daily, perhaps also try Imodium 1 p.o. every morning and then on a as needed basis for diarrhea, and have her go back through the elimination process with Fodmap diet.    Jean Jennings S Leavy Heatherly PA-C 11/27/2017   Cc: Alysia PennaHolwerda, Scott, MD

## 2017-11-28 NOTE — Progress Notes (Signed)
Reviewed and agree with documentation and assessment and plan. K. Veena Jasai Sorg , MD   

## 2018-01-09 ENCOUNTER — Other Ambulatory Visit: Payer: Self-pay | Admitting: Physician Assistant

## 2018-01-21 ENCOUNTER — Telehealth: Payer: Self-pay | Admitting: Physician Assistant

## 2018-01-21 NOTE — Telephone Encounter (Signed)
Calls because she had "a terrible weekend." She had a new symptoms for 2 days. She hurt "very low" just above her pelvic area. The pain was more intense if she "jostled" her abdomen such as when she gets up from sitting or she sits down. Her bowel movements were her baseline at about 3 loose movements in a day. She had a few waves of nausea without vomiting.  Today she is better. She is talking to me from a restaurant. She feels a tenderness in her lower abdomen. She asks should she come in to be seen here? She has tried numerous things for her diarrhea without resolution. This includes Viberzi.  She states though this seems to have passed, it was different.

## 2018-01-22 NOTE — Telephone Encounter (Signed)
I dont think she needs to come in right now- unfortunately  I don't have an easy fix for her - I would like her to try a trial  Of  Questran 4gm  In juice or water once daily  To be taken 2 hours away from other meds  - see if this helps her chronic diarrhea

## 2018-01-23 NOTE — Telephone Encounter (Signed)
Left this information and offer of Questran on her voicemail.

## 2018-01-26 ENCOUNTER — Encounter: Payer: Self-pay | Admitting: Physician Assistant

## 2018-01-31 ENCOUNTER — Other Ambulatory Visit: Payer: Self-pay

## 2018-02-10 ENCOUNTER — Other Ambulatory Visit: Payer: Self-pay | Admitting: Physician Assistant

## 2018-03-20 ENCOUNTER — Telehealth: Payer: Self-pay | Admitting: Physician Assistant

## 2018-03-20 ENCOUNTER — Other Ambulatory Visit: Payer: Self-pay | Admitting: Physician Assistant

## 2018-03-20 NOTE — Telephone Encounter (Signed)
Pt would like to speak with you, did not want to disclose more information.

## 2018-03-21 NOTE — Telephone Encounter (Signed)
Cannot find any VSL #3. Patient wants to know what you recommend as a substitute. I couldn't find any VSL #3 either.

## 2018-03-21 NOTE — Telephone Encounter (Signed)
Discussed with the patient. She will do this until VSL # 3 becomes available again.

## 2018-03-21 NOTE — Telephone Encounter (Signed)
VSL#3  is a mixture of 8 strains of lactic acid-producing bacteria (Lactobacillus plantarum, Lactobacillus delbrueckii subsp. Bulgaricus, Lactobacillus casei, Lactobacillus acidophilus, Bifidobacterium breve, Bifidobacterium longum, Bifidobacterium infantis and Streptococcus salivarius subsp. Thermophilus)  lease contact the drug rep and see if they are aware of any pharmacy that still has it? I do not think there is any one probiotic capsule which has all these different strains included in it.  If she is unable to get VS L#3 from any pharmacy or online, can consider taking Culturelle and align probiotics together.

## 2018-06-18 ENCOUNTER — Other Ambulatory Visit: Payer: Self-pay | Admitting: Internal Medicine

## 2018-06-18 DIAGNOSIS — Z1231 Encounter for screening mammogram for malignant neoplasm of breast: Secondary | ICD-10-CM

## 2018-07-09 ENCOUNTER — Ambulatory Visit
Admission: RE | Admit: 2018-07-09 | Discharge: 2018-07-09 | Disposition: A | Discharge status: Home / Self Care | Payer: Federal, State, Local not specified - PPO | Source: Ambulatory Visit | Attending: Internal Medicine | Admitting: Internal Medicine

## 2018-07-09 DIAGNOSIS — Z1231 Encounter for screening mammogram for malignant neoplasm of breast: Secondary | ICD-10-CM

## 2018-08-19 DIAGNOSIS — E039 Hypothyroidism, unspecified: Secondary | ICD-10-CM | POA: Insufficient documentation

## 2018-08-19 DIAGNOSIS — R251 Tremor, unspecified: Secondary | ICD-10-CM | POA: Insufficient documentation

## 2018-08-19 DIAGNOSIS — F329 Major depressive disorder, single episode, unspecified: Secondary | ICD-10-CM | POA: Insufficient documentation

## 2018-08-19 DIAGNOSIS — M152 Bouchard's nodes (with arthropathy): Secondary | ICD-10-CM | POA: Insufficient documentation

## 2018-08-19 DIAGNOSIS — R42 Dizziness and giddiness: Secondary | ICD-10-CM | POA: Insufficient documentation

## 2018-08-19 DIAGNOSIS — R232 Flushing: Secondary | ICD-10-CM | POA: Insufficient documentation

## 2018-08-19 DIAGNOSIS — R002 Palpitations: Secondary | ICD-10-CM | POA: Insufficient documentation

## 2018-08-19 DIAGNOSIS — E7800 Pure hypercholesterolemia, unspecified: Secondary | ICD-10-CM | POA: Insufficient documentation

## 2019-02-14 DIAGNOSIS — R040 Epistaxis: Secondary | ICD-10-CM | POA: Insufficient documentation

## 2019-05-20 ENCOUNTER — Ambulatory Visit: Payer: Federal, State, Local not specified - PPO | Admitting: Podiatry

## 2019-05-20 ENCOUNTER — Encounter: Payer: Self-pay | Admitting: Podiatry

## 2019-05-20 ENCOUNTER — Other Ambulatory Visit: Payer: Self-pay

## 2019-05-20 ENCOUNTER — Ambulatory Visit (INDEPENDENT_AMBULATORY_CARE_PROVIDER_SITE_OTHER): Payer: Federal, State, Local not specified - PPO

## 2019-05-20 DIAGNOSIS — M722 Plantar fascial fibromatosis: Secondary | ICD-10-CM

## 2019-05-20 MED ORDER — TRIAMCINOLONE ACETONIDE 10 MG/ML IJ SUSP
10.0000 mg | Freq: Once | INTRAMUSCULAR | Status: AC
  Administered 2019-05-20: 10 mg

## 2019-05-20 NOTE — Patient Instructions (Signed)

## 2019-05-25 NOTE — Progress Notes (Signed)
Subjective:   Patient ID: Normajean Baxter, female   DOB: 78 y.o.   MRN: [id number]   HPI 78 year old female presents the office today for concerns of right heel pain which is been ongoing for the last 2 months.  She states that she had a virtual visit with her primary care physician prescribed prednisone.  It did help while she was taking it.  After that she went naproxen which was also helpful but again did not eliminate the pain.  She describes pain on the bottom of her heel.  She gets some mild swelling to the end of the day.  She states that starting her into her ankle as well.  No recent injury or falls.  No numbness or tingling.  The pain does not wake her up at night.   Review of Systems  All other systems reviewed and are negative.  Past Medical History:  Diagnosis Date  . Anemia    as a younger woman  . Anxiety   . Arthritis   . Diarrhea last 3 to 4 months   stomach pain  . Dysrhythmia 3-4 yrs ago   hx irregular heart beat, dr Maxwell Caul manages  . GERD (gastroesophageal reflux disease)   . Hypothyroidism   . IBS (irritable bowel syndrome)   . PONV (postoperative nausea and vomiting)    " a little bit"    Past Surgical History:  Procedure Laterality Date  . ABDOMINAL HYSTERECTOMY  1970's  . arthroscopic surgery  oct 2012   left knee  . COLONOSCOPY WITH PROPOFOL N/A 06/03/2013   Procedure: COLONOSCOPY WITH PROPOFOL;  Surgeon: Garlan Fair, MD;  Location: [facility];  Service: Endoscopy;  Laterality: N/A;  . left wriist ligament surgery  sev yrs ago  . LUMBAR LAMINECTOMY/DECOMPRESSION MICRODISCECTOMY Left 06/09/2015   Procedure: Left Lumbar two-three Microdiskectomy;  Surgeon: Newman Pies, MD;  Location: [facility];  Service: Neurosurgery;  Laterality: Left;  Left L2-3 Microdiskectomy  . thryoid surgery  1963   partial  . TONSILLECTOMY       Current Outpatient Medications:  .  Influenza vac split quadrivalent PF (FLUZONE HIGH-DOSE) 0.5 ML injection, ADM 0.5ML IM  UTD, Disp: , Rfl:  .  acetaminophen (TYLENOL) 500 MG tablet, Take by mouth., Disp: , Rfl:  .  cefdinir (OMNICEF) 300 MG capsule, TK 1 C PO BID FOR 10 DAYS, Disp: , Rfl:  .  Cholecalciferol (VITAMIN D) 2000 UNITS CAPS, Take 1 capsule by mouth every evening. , Disp: , Rfl:  .  citalopram (CELEXA) 10 MG tablet, Take 5-10 mg by mouth every morning. Takes 5 mg one day and 10 mg  Next day alternates dosages, Disp: , Rfl:  .  glycopyrrolate (ROBINUL) 2 MG tablet, Take 1 tablet (2 mg total) by mouth 2 (two) times daily., Disp: 60 tablet, Rfl: 3 .  levothyroxine (SYNTHROID) 25 MCG tablet, TK 1 T PO QD, Disp: , Rfl:  .  levothyroxine (SYNTHROID, LEVOTHROID) 112 MCG tablet, Take 112 mcg by mouth daily before breakfast., Disp: , Rfl:  .  naproxen (NAPROSYN) 250 MG tablet, TK 1 TO 2 TS PO UP TO BID FOR RIGHT FOOT PAIN, Disp: , Rfl:  .  predniSONE (STERAPRED UNI-PAK 21 TAB) 5 MG (21) TBPK tablet, TAPER PACK WF OVER 6 DAYS, Disp: , Rfl:  .  PREMARIN 0.3 MG tablet, Take 1 tablet by mouth daily., Disp: , Rfl:  .  Probiotic Product (VSL#3) CAPS, TAKE ONE CAPSULE BY MOUTH TWICE DAILY FOR 4 TO  6 WEEKS, Disp: 60 capsule, Rfl: 0 .  propranolol (INDERAL) 40 MG tablet, Take 1 tablet by mouth 2 (two) times daily., Disp: , Rfl:  .  tretinoin (RETIN-A) 0.1 % cream, APPLY A PEA SIZED AMOUNT TO FACE NIGHTLY AT BEDTIME AS DIRECTED, Disp: , Rfl:   Allergies  Allergen Reactions  . Codeine Nausea And Vomiting    Lightheaded also          Objective:  Physical Exam  General: AAO x3, NAD  Dermatological: Skin is warm, dry and supple bilateral. Nails x 10 are well manicured; remaining integument appears unremarkable at this time. There are no open sores, no preulcerative lesions, no rash or signs of infection present.  Vascular: Dorsalis Pedis artery and Posterior Tibial artery pedal pulses are 2/4 bilateral with immedate capillary fill time. Pedal hair growth present. No varicosities and no lower extremity edema present  bilateral. There is no pain with calf compression, swelling, warmth, erythema.   Neruologic: Grossly intact via light touch bilateral. Vibratory intact via tuning fork bilateral. Protective threshold with Semmes Wienstein monofilament intact to all pedal sites bilateral. Negative tinel sign.   Musculoskeletal: Tenderness to palpation along the plantar medial tubercle of the calcaneus at the insertion of plantar fascia on the right foot. There is no pain along the course of the plantar fascia within the arch of the foot. Plantar fascia appears to be intact. There is no pain with lateral compression of the calcaneus or pain with vibratory sensation. There is mild subjective discomfort along the course of the achilles tendon. No specific pain today. Thompson test is negative. No other areas of tenderness to bilateral lower extremities.Muscular strength 5/5 in all groups tested bilateral.  Gait: Unassisted, Nonantalgic.      Assessment:   Right heel pain, plantar fasciitis     Plan:  -Treatment options discussed including all alternatives, risks, and complications -Etiology of symptoms were discussed -X-rays were obtained and reviewed with the patient. No evidence of acute fracture or stress fracture. -Steroid injection performed.  See procedure note below. -Plantar fascial brace. -Naproxen as needed -Continue stretching, icing daily. -Discussed shoe modifications and orthotics  Procedure: Injection Tendon/Ligament Discussed alternatives, risks, complications and verbal consent was obtained.  Location: Right  plantar fascia at the glabrous junction; medial approach. Skin Prep: Alcohol  Injectate: 0.5cc 0.5% marcaine plain, 0.5 cc 2% lidocaine plain and, 1 cc kenalog 10. Disposition: Patient tolerated procedure well. Injection site dressed with a band-aid.  Post-injection care was discussed and return precautions discussed.   Vivi BarrackMatthew R Bharath Bernstein DPM

## 2019-06-10 ENCOUNTER — Other Ambulatory Visit: Payer: Self-pay

## 2019-06-10 ENCOUNTER — Ambulatory Visit: Payer: Federal, State, Local not specified - PPO | Admitting: Podiatry

## 2019-06-10 DIAGNOSIS — M722 Plantar fascial fibromatosis: Secondary | ICD-10-CM

## 2019-06-10 NOTE — Patient Instructions (Signed)
Plantar Fasciitis (Heel Spur Syndrome) with Rehab The plantar fascia is a fibrous, ligament-like, soft-tissue structure that spans the bottom of the foot. Plantar fasciitis is a condition that causes pain in the foot due to inflammation of the tissue. SYMPTOMS   Pain and tenderness on the underneath side of the foot.  Pain that worsens with standing or walking. CAUSES  Plantar fasciitis is caused by irritation and injury to the plantar fascia on the underneath side of the foot. Common mechanisms of injury include:  Direct trauma to bottom of the foot.  Damage to a small nerve that runs under the foot where the main fascia attaches to the heel bone.  Stress placed on the plantar fascia due to bone spurs. RISK INCREASES WITH:   Activities that place stress on the plantar fascia (running, jumping, pivoting, or cutting).  Poor strength and flexibility.  Improperly fitted shoes.  Tight calf muscles.  Flat feet.  Failure to warm-up properly before activity.  Obesity. PREVENTION  Warm up and stretch properly before activity.  Allow for adequate recovery between workouts.  Maintain physical fitness:  Strength, flexibility, and endurance.  Cardiovascular fitness.  Maintain a health body weight.  Avoid stress on the plantar fascia.  Wear properly fitted shoes, including arch supports for individuals who have flat feet.  PROGNOSIS  If treated properly, then the symptoms of plantar fasciitis usually resolve without surgery. However, occasionally surgery is necessary.  RELATED COMPLICATIONS   Recurrent symptoms that may result in a chronic condition.  Problems of the lower back that are caused by compensating for the injury, such as limping.  Pain or weakness of the foot during push-off following surgery.  Chronic inflammation, scarring, and partial or complete fascia tear, occurring more often from repeated injections.  TREATMENT  Treatment initially involves the  use of ice and medication to help reduce pain and inflammation. The use of strengthening and stretching exercises may help reduce pain with activity, especially stretches of the Achilles tendon. These exercises may be performed at home or with a therapist. Your caregiver may recommend that you use heel cups of arch supports to help reduce stress on the plantar fascia. Occasionally, corticosteroid injections are given to reduce inflammation. If symptoms persist for greater than 6 months despite non-surgical (conservative), then surgery may be recommended.   MEDICATION   If pain medication is necessary, then nonsteroidal anti-inflammatory medications, such as aspirin and ibuprofen, or other minor pain relievers, such as acetaminophen, are often recommended.  Do not take pain medication within 7 days before surgery.  Prescription pain relievers may be given if deemed necessary by your caregiver. Use only as directed and only as much as you need.  Corticosteroid injections may be given by your caregiver. These injections should be reserved for the most serious cases, because they may only be given a certain number of times.  HEAT AND COLD  Cold treatment (icing) relieves pain and reduces inflammation. Cold treatment should be applied for 10 to 15 minutes every 2 to 3 hours for inflammation and pain and immediately after any activity that aggravates your symptoms. Use ice packs or massage the area with a piece of ice (ice massage).  Heat treatment may be used prior to performing the stretching and strengthening activities prescribed by your caregiver, physical therapist, or athletic trainer. Use a heat pack or soak the injury in warm water.  SEEK IMMEDIATE MEDICAL CARE IF:  Treatment seems to offer no benefit, or the condition worsens.  Any medications   produce adverse side effects.  EXERCISES- RANGE OF MOTION (ROM) AND STRETCHING EXERCISES - Plantar Fasciitis (Heel Spur Syndrome) These exercises  may help you when beginning to rehabilitate your injury. Your symptoms may resolve with or without further involvement from your physician, physical therapist or athletic trainer. While completing these exercises, remember:   Restoring tissue flexibility helps normal motion to return to the joints. This allows healthier, less painful movement and activity.  An effective stretch should be held for at least 30 seconds.  A stretch should never be painful. You should only feel a gentle lengthening or release in the stretched tissue.  RANGE OF MOTION - Toe Extension, Flexion  Sit with your right / left leg crossed over your opposite knee.  Grasp your toes and gently pull them back toward the top of your foot. You should feel a stretch on the bottom of your toes and/or foot.  Hold this stretch for 10 seconds.  Now, gently pull your toes toward the bottom of your foot. You should feel a stretch on the top of your toes and or foot.  Hold this stretch for 10 seconds. Repeat  times. Complete this stretch 3 times per day.   RANGE OF MOTION - Ankle Dorsiflexion, Active Assisted  Remove shoes and sit on a chair that is preferably not on a carpeted surface.  Place right / left foot under knee. Extend your opposite leg for support.  Keeping your heel down, slide your right / left foot back toward the chair until you feel a stretch at your ankle or calf. If you do not feel a stretch, slide your bottom forward to the edge of the chair, while still keeping your heel down.  Hold this stretch for 10 seconds. Repeat 3 times. Complete this stretch 2 times per day.   STRETCH  Gastroc, Standing  Place hands on wall.  Extend right / left leg, keeping the front knee somewhat bent.  Slightly point your toes inward on your back foot.  Keeping your right / left heel on the floor and your knee straight, shift your weight toward the wall, not allowing your back to arch.  You should feel a gentle stretch  in the right / left calf. Hold this position for 10 seconds. Repeat 3 times. Complete this stretch 2 times per day.  STRETCH  Soleus, Standing  Place hands on wall.  Extend right / left leg, keeping the other knee somewhat bent.  Slightly point your toes inward on your back foot.  Keep your right / left heel on the floor, bend your back knee, and slightly shift your weight over the back leg so that you feel a gentle stretch deep in your back calf.  Hold this position for 10 seconds. Repeat 3 times. Complete this stretch 2 times per day.  STRETCH  Gastrocsoleus, Standing  Note: This exercise can place a lot of stress on your foot and ankle. Please complete this exercise only if specifically instructed by your caregiver.   Place the ball of your right / left foot on a step, keeping your other foot firmly on the same step.  Hold on to the wall or a rail for balance.  Slowly lift your other foot, allowing your body weight to press your heel down over the edge of the step.  You should feel a stretch in your right / left calf.  Hold this position for 10 seconds.  Repeat this exercise with a slight bend in your right /   left knee. Repeat 3 times. Complete this stretch 2 times per day.   STRENGTHENING EXERCISES - Plantar Fasciitis (Heel Spur Syndrome)  These exercises may help you when beginning to rehabilitate your injury. They may resolve your symptoms with or without further involvement from your physician, physical therapist or athletic trainer. While completing these exercises, remember:   Muscles can gain both the endurance and the strength needed for everyday activities through controlled exercises.  Complete these exercises as instructed by your physician, physical therapist or athletic trainer. Progress the resistance and repetitions only as guided.  STRENGTH - Towel Curls  Sit in a chair positioned on a non-carpeted surface.  Place your foot on a towel, keeping your heel  on the floor.  Pull the towel toward your heel by only curling your toes. Keep your heel on the floor. Repeat 3 times. Complete this exercise 2 times per day.  STRENGTH - Ankle Inversion  Secure one end of a rubber exercise band/tubing to a fixed object (table, pole). Loop the other end around your foot just before your toes.  Place your fists between your knees. This will focus your strengthening at your ankle.  Slowly, pull your big toe up and in, making sure the band/tubing is positioned to resist the entire motion.  Hold this position for 10 seconds.  Have your muscles resist the band/tubing as it slowly pulls your foot back to the starting position. Repeat 3 times. Complete this exercises 2 times per day.  Document Released: 11/27/2005 Document Revised: 02/19/2012 Document Reviewed: 03/11/2009 [facility] Patient Information 2014 [place] [place].  Ice Massage and Cold Packs Home Program  Ice and cold packs are used so that we can return the muscle to it's natural resting state without causing more pain, which can lead to more spasm, etc.  Icing and cold packs are also used to reduce swelling, which can lead to pain and stiffness.  COLD PACKS Cold packs should be placed circumferentially around the swollen area (ie., the wrist, etc.).  A paper towel can be placed over the area before the cold pack is applied.  A towel may be wrapped around the outside of the cold pack to keep the cold in.  The swollen area should be elevated with the cold pack, if possible.  ICE MASSAGE Fill a 4 ounce paper cup three-quarters full and put it in a freezer until it is frozen.  When ready to use, tear off about 1 inch of the cup so that some of the ice is showing while the bottom of the cup can be used to hold onto. Massage the entire muscle area as instructed by your therapist.  You may use circular or up and down strokes, but do not hold the ice in one spot.  Four phases to the ice massage and cold  pack application: 1. Cold: which you feel when you first apply the ice. 2. Ache: after a few minutes 3. Burning: after approximately five minutes, it will feel like your skin is burning.  At this point, remove the ice for a minute or so. 4. Numbness: THIS IS THE CRUCIAL PHASE!!! Return the ice or cold pack and massage until all the burning disappears.  This signals the end of cryotherapy.  The entire procedure should take ten to fifteen minutes while using the cold pack.  Do Not perform the ice massage for more than seven minutes on a small area or more than ten minutes on a large area.  Cryotherapy should be  performed after exercises, when edema occurs, or when an area is painful.

## 2019-06-10 NOTE — Progress Notes (Signed)
Subjective: 78 year old female presents the office today for follow-up evaluation of right heel pain, plantar fasciitis.  She states that she is doing better she still has discomfort.  She is been wearing the plantar fascial brace.  She has been stretching intermittently.  She has not been icing.  She is not taking naproxen. Denies any systemic complaints such as fevers, chills, nausea, vomiting. No acute changes since last appointment, and no other complaints at this time.   Objective: AAO x3, NAD DP/PT pulses palpable bilaterally, CRT less than 3 seconds Improved but yet continued tenderness palpation on the plantar medial tubercle of the calcaneus insertion of plantar fascial.  Plantar fascial appears to be intact. No pain with lateral compression of calcaneus.  No edema, erythema.  Achilles tendon intact. No open lesions or pre-ulcerative lesions.  No pain with calf compression, swelling, warmth, erythema  Assessment: Right foot plantar fascial  Plan: -All treatment options discussed with the patient including all alternatives, risks, complications.  -Second steroid injection performed.  See procedure note below.  Continue stretching, icing daily.  She has naproxen at home and discussed using this. -Patient encouraged to call the office with any questions, concerns, change in symptoms.   Procedure: Injection Tendon/Ligament Discussed alternatives, risks, complications and verbal consent was obtained.  Location: Right plantar fascia at the glabrous junction; medial approach. Skin Prep: Alcohol Injectate: 0.5cc 0.5% marcaine plain, 0.5 cc 2% lidocaine plain and, 1 cc kenalog 10. Disposition: Patient tolerated procedure well. Injection site dressed with a band-aid.  Post-injection care was discussed and return precautions discussed.   Trula Slade DPM

## 2019-07-08 ENCOUNTER — Ambulatory Visit: Payer: Federal, State, Local not specified - PPO | Admitting: Podiatry

## 2019-07-11 ENCOUNTER — Other Ambulatory Visit (HOSPITAL_COMMUNITY): Payer: Self-pay | Admitting: Family Medicine

## 2019-07-11 DIAGNOSIS — R002 Palpitations: Secondary | ICD-10-CM

## 2019-07-14 ENCOUNTER — Other Ambulatory Visit: Payer: Self-pay | Admitting: *Deleted

## 2019-07-14 ENCOUNTER — Telehealth: Payer: Self-pay | Admitting: *Deleted

## 2019-07-14 DIAGNOSIS — R002 Palpitations: Secondary | ICD-10-CM

## 2019-07-14 NOTE — Telephone Encounter (Signed)
3 day ZIO XT patch to be mailed to patients home.  Instructions reviewed.  Patient requested to wear at least 24 hours.  Patient would like result by physical schedueled 08/05/2019.

## 2019-07-18 ENCOUNTER — Other Ambulatory Visit: Payer: Self-pay

## 2019-07-18 ENCOUNTER — Ambulatory Visit (HOSPITAL_COMMUNITY): Payer: Federal, State, Local not specified - PPO | Attending: Internal Medicine

## 2019-07-18 DIAGNOSIS — R002 Palpitations: Secondary | ICD-10-CM | POA: Diagnosis present

## 2019-07-19 ENCOUNTER — Ambulatory Visit (INDEPENDENT_AMBULATORY_CARE_PROVIDER_SITE_OTHER): Payer: Federal, State, Local not specified - PPO

## 2019-07-19 DIAGNOSIS — R002 Palpitations: Secondary | ICD-10-CM | POA: Diagnosis not present

## 2019-07-22 NOTE — Progress Notes (Deleted)
Cardiology Office Note:    Date:  07/23/2019   ID:  Jean BostonJane C Jennings, DOB [birth year], MRN [id number]  PCP:  Alysia PennaHolwerda, Scott, MD  Cardiologist:  Norman HerrlichBrian Dayon Witt, MD   Referring MD: Alysia PennaHolwerda, Scott, MD  ASSESSMENT:    No diagnosis found. PLAN:    In order of problems listed above:  1. ***  Next appointment   Medication Adjustments/Labs and Tests Ordered: Current medicines are reviewed at length with the patient today.  Concerns regarding medicines are outlined above.  No orders of the defined types were placed in this encounter.  No orders of the defined types were placed in this encounter.    No chief complaint on file. ***  History of Present Illness:    Jean Jennings is a 78 y.o. female who is being seen today for the evaluation of palpitations at the request of Alysia PennaHolwerda, Scott, MD. She has a PMH of anemia, arthritis, and thyroid disease.    Past Medical History:  Diagnosis Date  . Anemia    as a younger woman  . Anxiety   . Arthritis   . Diarrhea last 3 to 4 months   stomach pain  . Dysrhythmia 3-4 yrs ago   hx irregular heart beat, dr Earl Galaosborne manages  . GERD (gastroesophageal reflux disease)   . Hypothyroidism   . IBS (irritable bowel syndrome)   . PONV (postoperative nausea and vomiting)    " a little bit"    Past Surgical History:  Procedure Laterality Date  . ABDOMINAL HYSTERECTOMY  1970's  . arthroscopic surgery  oct 2012   left knee  . COLONOSCOPY WITH PROPOFOL N/A 06/03/2013   Procedure: COLONOSCOPY WITH PROPOFOL;  Surgeon: Charolett BumpersMartin K Johnson, MD;  Location: [facility];  Service: Endoscopy;  Laterality: N/A;  . left wriist ligament surgery  sev yrs ago  . LUMBAR LAMINECTOMY/DECOMPRESSION MICRODISCECTOMY Left 06/09/2015   Procedure: Left Lumbar two-three Microdiskectomy;  Surgeon: Tressie StalkerJeffrey Jenkins, MD;  Location: [facility];  Service: Neurosurgery;  Laterality: Left;  Left L2-3 Microdiskectomy  . thryoid surgery  1963   partial  . TONSILLECTOMY       Current Medications: No outpatient medications have been marked as taking for the 07/23/19 encounter (Appointment) with Baldo DaubMunley, Janaya Broy J, MD.     Allergies:   Codeine   Social History   Socioeconomic History  . Marital status: Married    Spouse name: Not on file  . Number of children: Not on file  . Years of education: Not on file  . Highest education level: Not on file  Occupational History  . Not on file  Social Needs  . Financial resource strain: Not on file  . Food insecurity    Worry: Not on file    Inability: Not on file  . Transportation needs    Medical: Not on file    Non-medical: Not on file  Tobacco Use  . Smoking status: Former Smoker    Packs/day: 0.25    Years: 3.00    Pack years: 0.75    Types: Cigarettes    Quit date: 12/11/1968    Years since quitting: 50.6  . Smokeless tobacco: Never Used  Substance and Sexual Activity  . Alcohol use: Yes    Comment: occasional  . Drug use: No  . Sexual activity: Not on file  Lifestyle  . Physical activity    Days per week: Not on file    Minutes per session: Not on file  . Stress: Not on  file  Relationships  . Social Herbalist on phone: Not on file    Gets together: Not on file    Attends religious service: Not on file    Active member of club or organization: Not on file    Attends meetings of clubs or organizations: Not on file    Relationship status: Not on file  Other Topics Concern  . Not on file  Social History Narrative  . Not on file     Family History: The patient's ***family history includes Bone cancer in her father; Colon cancer in her mother; Liver cancer in her mother.  ROS:   ROS Please see the history of present illness.    *** All other systems reviewed and are negative.  EKGs/Labs/Other Studies Reviewed:    The following studies were reviewed today: ***  EKG:  EKG is *** ordered today.  The ekg ordered today is personally reviewed and demonstrates ***  Recent Labs: No  results found for requested labs within last 8760 hours.  Recent Lipid Panel No results found for: CHOL, TRIG, HDL, CHOLHDL, VLDL, LDLCALC, LDLDIRECT  Physical Exam:    VS:  There were no vitals taken for this visit.    Wt Readings from Last 3 Encounters:  11/27/17 150 lb (68 kg)  08/21/17 149 lb (67.6 kg)  06/29/16 145 lb (65.8 kg)     GEN: *** Well nourished, well developed in no acute distress HEENT: Normal NECK: No JVD; No carotid bruits LYMPHATICS: No lymphadenopathy CARDIAC: ***RRR, no murmurs, rubs, gallops RESPIRATORY:  Clear to auscultation without rales, wheezing or rhonchi  ABDOMEN: Soft, non-tender, non-distended MUSCULOSKELETAL:  No edema; No deformity  SKIN: Warm and dry NEUROLOGIC:  Alert and oriented x 3 PSYCHIATRIC:  Normal affect     Signed, Shirlee More, MD  07/23/2019 7:56 AM    [place]

## 2019-07-23 ENCOUNTER — Telehealth: Payer: Self-pay | Admitting: Cardiology

## 2019-07-23 ENCOUNTER — Ambulatory Visit: Payer: Federal, State, Local not specified - PPO | Admitting: Cardiology

## 2019-07-23 NOTE — Telephone Encounter (Signed)
New Message    Patient is requesting to switch from Dr. Bettina Gavia to Dr. Gwenlyn Found. Patients spouse see Dr. Gwenlyn Found so she is familiar with him.

## 2019-07-23 NOTE — Telephone Encounter (Signed)
OK with me, she has made a good choice

## 2019-07-23 NOTE — Telephone Encounter (Signed)
Fine with me as well

## 2019-07-24 NOTE — Telephone Encounter (Signed)
Spoke with pt, Follow up scheduled  

## 2019-08-12 ENCOUNTER — Other Ambulatory Visit: Payer: Self-pay | Admitting: Internal Medicine

## 2019-08-12 DIAGNOSIS — Z1231 Encounter for screening mammogram for malignant neoplasm of breast: Secondary | ICD-10-CM

## 2019-08-13 ENCOUNTER — Ambulatory Visit: Payer: Federal, State, Local not specified - PPO | Admitting: Cardiology

## 2019-08-15 ENCOUNTER — Ambulatory Visit: Payer: Federal, State, Local not specified - PPO | Admitting: Cardiology

## 2019-08-22 ENCOUNTER — Other Ambulatory Visit: Payer: Self-pay

## 2019-08-22 ENCOUNTER — Ambulatory Visit
Admission: RE | Admit: 2019-08-22 | Discharge: 2019-08-22 | Disposition: A | Discharge status: Home / Self Care | Payer: Federal, State, Local not specified - PPO | Source: Ambulatory Visit | Attending: Internal Medicine | Admitting: Internal Medicine

## 2019-08-22 DIAGNOSIS — Z1231 Encounter for screening mammogram for malignant neoplasm of breast: Secondary | ICD-10-CM

## 2019-08-26 ENCOUNTER — Encounter: Payer: Self-pay | Admitting: Cardiovascular Disease

## 2019-08-26 ENCOUNTER — Other Ambulatory Visit: Payer: Self-pay

## 2019-08-26 ENCOUNTER — Ambulatory Visit: Payer: Federal, State, Local not specified - PPO | Admitting: Cardiovascular Disease

## 2019-08-26 VITALS — BP 134/72 | HR 51 | Temp 97.9°F | Wt 151.0 lb

## 2019-08-26 DIAGNOSIS — E7800 Pure hypercholesterolemia, unspecified: Secondary | ICD-10-CM

## 2019-08-26 DIAGNOSIS — R002 Palpitations: Secondary | ICD-10-CM

## 2019-08-26 DIAGNOSIS — Z008 Encounter for other general examination: Secondary | ICD-10-CM

## 2019-08-26 NOTE — Assessment & Plan Note (Signed)
Jean Jennings was referred to me by Dr. Ardeth Perfect  for palpitations.  She is a long history of palpitations of the last 30 to 40 years she was on metoprolol which was then changed to Inderal because of benign essential tremor.  She does have history of hypothyroidism and is on thyroid replacement with elevated TSH measured several months ago.  She denies drinking caffeine or other stimulants.  She recently had an event monitor that showed sinus rhythm/sinus bradycardia with rare PACs and PVCs with a 2D echo that was normal.  She relates the increased frequency of her palpitations after 2 steroid injections in her foot for plantar fascia-itis by Dr. Jacqualyn Posey.  Palpitations are somewhat improved.  When she gets these palpitations she does feel somewhat short of breath.  Given her risk factors I am getting a coronary calcium score to her stratify her to make sure that that these are not ischemically mediated.

## 2019-08-26 NOTE — Progress Notes (Signed)
08/26/2019 Jean Jennings   [birth year]  [id number]  Primary Physician Velna Hatchet, MD Primary Cardiologist: Lorretta Harp MD [name], [place]  HPI:  Jean Jennings is a 78 y.o. thin and fit appearing married Caucasian female mother of 93, grandmother 7 grandchildren who is retired from working the [employer].  She was referred by Dr. Ardeth Perfect for evaluation of symptomatic palpitations.  She has a positive family history for heart disease.  She has had palpitations for decades worse in the last 2 to 3 months after having 2 steroid injections in her foot for symptomatic plantar fasciitis by Dr. Jacqualyn Posey.  She had a recent event monitor that showed sinus rhythm/sinus bradycardia with occasional PVCs and PACs and a normal 2D echo.  She gets occasional shortness of breath when she has her palpitations but denies chest pain.   Current Meds  Medication Sig  . Cholecalciferol (VITAMIN D) 2000 UNITS CAPS Take 1 capsule by mouth every evening.   . citalopram (CELEXA) 10 MG tablet Take 5-10 mg by mouth every morning. Takes 5 mg one day and 10 mg  Next day alternates dosages  . Influenza vac split quadrivalent PF (FLUZONE HIGH-DOSE) 0.5 ML injection ADM 0.5ML IM UTD  . levothyroxine (SYNTHROID) 25 MCG tablet TK 1 T PO QD  . levothyroxine (SYNTHROID, LEVOTHROID) 112 MCG tablet Take 112 mcg by mouth daily before breakfast.  . naproxen (NAPROSYN) 250 MG tablet TK 1 TO 2 TS PO UP TO BID FOR RIGHT FOOT PAIN  . PREMARIN 0.3 MG tablet Take 1 tablet by mouth daily.  . Probiotic Product (PROBIOTIC DAILY PO) Take 1 capsule by mouth daily.  . propranolol (INDERAL) 40 MG tablet Take 1 tablet by mouth 2 (two) times daily.  Marland Kitchen tretinoin (RETIN-A) 0.1 % cream APPLY A PEA SIZED AMOUNT TO FACE NIGHTLY AT BEDTIME AS DIRECTED  . [DISCONTINUED] acetaminophen (TYLENOL) 500 MG tablet Take by mouth.  . [DISCONTINUED] cefdinir (OMNICEF) 300 MG capsule TK 1 C PO BID FOR 10 DAYS  . [DISCONTINUED]  glycopyrrolate (ROBINUL) 2 MG tablet Take 1 tablet (2 mg total) by mouth 2 (two) times daily.  . [DISCONTINUED] predniSONE (STERAPRED UNI-PAK 21 TAB) 5 MG (21) TBPK tablet TAPER PACK WF OVER 6 DAYS     Allergies  Allergen Reactions  . Codeine Nausea And Vomiting    Lightheaded also    Social History   Socioeconomic History  . Marital status: Married    Spouse name: Not on file  . Number of children: Not on file  . Years of education: Not on file  . Highest education level: Not on file  Occupational History  . Not on file  Social Needs  . Financial resource strain: Not on file  . Food insecurity    Worry: Not on file    Inability: Not on file  . Transportation needs    Medical: Not on file    Non-medical: Not on file  Tobacco Use  . Smoking status: Former Smoker    Packs/day: 0.25    Years: 3.00    Pack years: 0.75    Types: Cigarettes    Quit date: 12/11/1968    Years since quitting: 50.7  . Smokeless tobacco: Never Used  Substance and Sexual Activity  . Alcohol use: Yes    Comment: occasional  . Drug use: No  . Sexual activity: Not on file  Lifestyle  . Physical activity    Days per week: Not on  file    Minutes per session: Not on file  . Stress: Not on file  Relationships  . Social Musicianconnections    Talks on phone: Not on file    Gets together: Not on file    Attends religious service: Not on file    Active member of club or organization: Not on file    Attends meetings of clubs or organizations: Not on file    Relationship status: Not on file  . Intimate partner violence    Fear of current or ex partner: Not on file    Emotionally abused: Not on file    Physically abused: Not on file    Forced sexual activity: Not on file  Other Topics Concern  . Not on file  Social History Narrative  . Not on file     Review of Systems: General: negative for chills, fever, night sweats or weight changes.  Cardiovascular: negative for chest pain, dyspnea on exertion,  edema, orthopnea, palpitations, paroxysmal nocturnal dyspnea or shortness of breath Dermatological: negative for rash Respiratory: negative for cough or wheezing Urologic: negative for hematuria Abdominal: negative for nausea, vomiting, diarrhea, bright red blood per rectum, melena, or hematemesis Neurologic: negative for visual changes, syncope, or dizziness All other systems reviewed and are otherwise negative except as noted above.    Blood pressure 134/72, pulse (!) 51, temperature 97.9 F (36.6 C), weight 151 lb (68.5 kg).  General appearance: alert and no distress Neck: no adenopathy, no carotid bruit, no JVD, supple, symmetrical, trachea midline and thyroid not enlarged, symmetric, no tenderness/mass/nodules Lungs: clear to auscultation bilaterally Heart: regular rate and rhythm, S1, S2 normal, no murmur, click, rub or gallop Extremities: extremities normal, atraumatic, no cyanosis or edema Pulses: 2+ and symmetric Skin: Skin color, texture, turgor normal. No rashes or lesions Neurologic: Alert and oriented X 3, normal strength and tone. Normal symmetric reflexes. Normal coordination and gait  EKG sinus bradycardia 51 without ST or T wave changes.  Personally reviewed this EKG.  ASSESSMENT AND PLAN:   Hypercholesterolemia History of hyperlipidemia with lipid profile performed 07/29/2019 revealing total cholesterol 234, LDL 145 and HDL 63.  She does admit to dietary discretion.  We will readdress this after she has adjusted her diet and have this rechecked  Palpitations Ms. Tennis ShipHorne was referred to me by Dr. Link SnufferHolwerda  for palpitations.  She is a long history of palpitations of the last 30 to 40 years she was on metoprolol which was then changed to Inderal because of benign essential tremor.  She does have history of hypothyroidism and is on thyroid replacement with elevated TSH measured several months ago.  She denies drinking caffeine or other stimulants.  She recently had an event  monitor that showed sinus rhythm/sinus bradycardia with rare PACs and PVCs with a 2D echo that was normal.  She relates the increased frequency of her palpitations after 2 steroid injections in her foot for plantar fascia-itis by Dr. Ardelle AntonWagoner.  Palpitations are somewhat improved.  When she gets these palpitations she does feel somewhat short of breath.  Given her risk factors I am getting a coronary calcium score to her stratify her to make sure that that these are not ischemically mediated.      Runell GessJonathan J. Javien Tesch MD FACP,FACC,FAHA, [facility] 08/26/2019 11:27 AM

## 2019-08-26 NOTE — Assessment & Plan Note (Signed)
History of hyperlipidemia with lipid profile performed 07/29/2019 revealing total cholesterol 234, LDL 145 and HDL 63.  She does admit to dietary discretion.  We will readdress this after she has adjusted her diet and have this rechecked

## 2019-08-26 NOTE — Patient Instructions (Signed)
Medication Instructions:  Your physician recommends that you continue on your current medications as directed. Please refer to the Current Medication list given to you today.  If you need a refill on your cardiac medications before your next appointment, please call your pharmacy.   Lab work: none If you have labs (blood work) drawn today and your tests are completely normal, you will receive your results only by: Marland Kitchen MyChart Message (if you have MyChart) OR . A paper copy in the mail If you have any lab test that is abnormal or we need to change your treatment, we will call you to review the results.  Testing/Procedures: Your physician has recommended that you have a coronary calcium scan. This will cost $150 out-of-pocket.  Coronary Calcium Scan A coronary calcium scan is an imaging test used to look for deposits of calcium and other fatty materials (plaques) in the inner lining of the blood vessels of the heart (coronary arteries). These deposits of calcium and plaques can partly clog and narrow the coronary arteries without producing any symptoms or warning signs. This puts a person at risk for a heart attack. This test can detect these deposits before symptoms develop. Tell a health care provider about:  Any allergies you have.  All medicines you are taking, including vitamins, herbs, eye drops, creams, and over-the-counter medicines.  Any problems you or family members have had with anesthetic medicines.  Any blood disorders you have.  Any surgeries you have had.  Any medical conditions you have.  Whether you are pregnant or may be pregnant. What are the risks? Generally, this is a safe procedure. However, problems may occur, including:  Harm to a pregnant woman and her unborn baby. This test involves the use of radiation. Radiation exposure can be dangerous to a pregnant woman and her unborn baby. If you are pregnant, you generally should not have this procedure done.   Slight increase in the risk of cancer. This is because of the radiation involved in the test. What happens before the procedure? No preparation is needed for this procedure. What happens during the procedure?   You will undress and remove any jewelry around your neck or chest.  You will put on a hospital gown.  Sticky electrodes will be placed on your chest. The electrodes will be connected to an electrocardiogram (ECG) machine to record a tracing of the electrical activity of your heart.  A CT scanner will take pictures of your heart. During this time, you will be asked to lie still and hold your breath for 2-3 seconds while a picture of your heart is being taken. The procedure may vary among health care providers and hospitals. What happens after the procedure?  You can get dressed.  You can return to your normal activities.  It is up to you to get the results of your test. Ask your health care provider, or the department that is doing the test, when your results will be ready. Summary  A coronary calcium scan is an imaging test used to look for deposits of calcium and other fatty materials (plaques) in the inner lining of the blood vessels of the heart (coronary arteries).  Generally, this is a safe procedure. Tell your health care provider if you are pregnant or may be pregnant.  No preparation is needed for this procedure.  A CT scanner will take pictures of your heart.  You can return to your normal activities after the scan is done. This information is not  intended to replace advice given to you by your health care provider. Make sure you discuss any questions you have with your health care provider.  Follow-Up: At [facility], you and your health needs are our priority.  As part of our continuing mission to provide you with exceptional heart care, we have created designated Provider Care Teams.  These Care Teams include your primary Cardiologist (physician) and Advanced  Practice Providers (APPs -  Physician Assistants and Nurse Practitioners) who all work together to provide you with the care you need, when you need it. . You will need a follow up appointment in 6 months with Dr. Quay Burow.  Please call our office 2 months in advance to schedule this appointment.

## 2019-09-25 ENCOUNTER — Ambulatory Visit (INDEPENDENT_AMBULATORY_CARE_PROVIDER_SITE_OTHER)
Admission: RE | Admit: 2019-09-25 | Discharge: 2019-09-25 | Disposition: A | Discharge status: Home / Self Care | Payer: Self-pay | Source: Ambulatory Visit | Attending: Cardiovascular Disease | Admitting: Cardiovascular Disease

## 2019-09-25 ENCOUNTER — Other Ambulatory Visit: Payer: Self-pay

## 2019-09-25 DIAGNOSIS — R002 Palpitations: Secondary | ICD-10-CM

## 2019-10-20 ENCOUNTER — Telehealth: Payer: Self-pay | Admitting: Cardiovascular Disease

## 2019-10-20 NOTE — Telephone Encounter (Signed)
Called patient, gave CT scoring results. Patient verbalized understanding.

## 2019-10-20 NOTE — Telephone Encounter (Signed)
Patient would like to get her CT result.

## 2020-02-12 ENCOUNTER — Ambulatory Visit: Payer: Federal, State, Local not specified - PPO | Admitting: Cardiovascular Disease

## 2020-03-02 ENCOUNTER — Ambulatory Visit: Payer: Federal, State, Local not specified - PPO | Admitting: Cardiovascular Disease

## 2020-03-02 ENCOUNTER — Other Ambulatory Visit: Payer: Self-pay

## 2020-03-02 ENCOUNTER — Encounter: Payer: Self-pay | Admitting: Cardiovascular Disease

## 2020-03-02 VITALS — BP 118/58 | HR 54 | Ht 64.0 in | Wt 149.0 lb

## 2020-03-02 DIAGNOSIS — E7800 Pure hypercholesterolemia, unspecified: Secondary | ICD-10-CM

## 2020-03-02 DIAGNOSIS — R002 Palpitations: Secondary | ICD-10-CM | POA: Diagnosis not present

## 2020-03-02 NOTE — Patient Instructions (Signed)
Medication Instructions:  NO CHANGE *If you need a refill on your cardiac medications before your next appointment, please call your pharmacy*   Lab Work: Your physician recommends that you return for lab work in 3 MONTHS PRIOR TO EATING  If you have labs (blood work) drawn today and your tests are completely normal, you will receive your results only by: Marland Kitchen MyChart Message (if you have MyChart) OR . A paper copy in the mail If you have any lab test that is abnormal or we need to change your treatment, we will call you to review the results.  Follow-Up: At [facility], you and your health needs are our priority.  As part of our continuing mission to provide you with exceptional heart care, we have created designated Provider Care Teams.  These Care Teams include your primary Cardiologist (physician) and Advanced Practice Providers (APPs -  Physician Assistants and Nurse Practitioners) who all work together to provide you with the care you need, when you need it.  We recommend signing up for the patient portal called "MyChart".  Sign up information is provided on this After Visit Summary.  MyChart is used to connect with patients for Virtual Visits (Telemedicine).  Patients are able to view lab/test results, encounter notes, upcoming appointments, etc.  Non-urgent messages can be sent to your provider as well.   To learn more about what you can do with MyChart, go to [email or web address].    Your next appointment:   AS NEEDED

## 2020-03-02 NOTE — Assessment & Plan Note (Signed)
History of palpitations in the past with an event monitor revealing PACs and PVCs but these have since resolved.  She is on chronic low-dose beta-blocker.

## 2020-03-02 NOTE — Progress Notes (Signed)
03/02/2020 Jean Jennings   [birth year]  [id number]  Primary Physician Alysia Penna, MD Primary Cardiologist: Runell Gess MD Nicholes Calamity, [place]  HPI:  Jean Jennings is a 79 y.o.  thin and fit appearing married Caucasian female mother of 3, grandmother 7 grandchildren who is retired from working in [employer].  She was referred by Dr. Link Snuffer for evaluation of symptomatic palpitations.  She has a positive family history for heart disease.  She has had palpitations for decades worse in the last 2 to 3 months after having 2 steroid injections in her foot for symptomatic plantar fasciitis by Dr. Ardelle Anton.  She had a recent event monitor that showed sinus rhythm/sinus bradycardia with occasional PVCs and PACs and a normal 2D echo.  She gets occasional shortness of breath when she has her palpitations but denies chest pain.  Since I saw her back in September of last year her palpitations have since resolved.  She denies chest pain or shortness of breath.  She does have moderate hyperlipidemia with total cholesterol 234 and LDL 145 measured on 07/29/2019 and is currently not on a statin drug.   No outpatient medications have been marked as taking for the 03/02/20 encounter (Office Visit) with Runell Gess, MD.     Allergies  Allergen Reactions  . Codeine Nausea And Vomiting    Lightheaded also    Social History   Socioeconomic History  . Marital status: Married    Spouse name: Not on file  . Number of children: Not on file  . Years of education: Not on file  . Highest education level: Not on file  Occupational History  . Not on file  Tobacco Use  . Smoking status: Former Smoker    Packs/day: 0.25    Years: 3.00    Pack years: 0.75    Types: Cigarettes    Quit date: 12/11/1968    Years since quitting: 51.2  . Smokeless tobacco: Never Used  Substance and Sexual Activity  . Alcohol use: Yes    Comment: occasional  . Drug use: No  . Sexual activity: Not on  file  Other Topics Concern  . Not on file  Social History Narrative  . Not on file   Social Determinants of Health   Financial Resource Strain:   . Difficulty of Paying Living Expenses:   Food Insecurity:   . Worried About Programme researcher, broadcasting/film/video in the Last Year:   . Barista in the Last Year:   Transportation Needs:   . Freight forwarder (Medical):   Marland Kitchen Lack of Transportation (Non-Medical):   Physical Activity:   . Days of Exercise per Week:   . Minutes of Exercise per Session:   Stress:   . Feeling of Stress :   Social Connections:   . Frequency of Communication with Friends and Family:   . Frequency of Social Gatherings with Friends and Family:   . Attends Religious Services:   . Active Member of Clubs or Organizations:   . Attends Banker Meetings:   Marland Kitchen Marital Status:   Intimate Partner Violence:   . Fear of Current or Ex-Partner:   . Emotionally Abused:   Marland Kitchen Physically Abused:   . Sexually Abused:      Review of Systems: General: negative for chills, fever, night sweats or weight changes.  Cardiovascular: negative for chest pain, dyspnea on exertion, edema, orthopnea, palpitations, paroxysmal nocturnal dyspnea or shortness  of breath Dermatological: negative for rash Respiratory: negative for cough or wheezing Urologic: negative for hematuria Abdominal: negative for nausea, vomiting, diarrhea, bright red blood per rectum, melena, or hematemesis Neurologic: negative for visual changes, syncope, or dizziness All other systems reviewed and are otherwise negative except as noted above.    Blood pressure (!) 118/58, pulse (!) 54, height 5\' 4"  (1.626 m), weight 149 lb (67.6 kg), SpO2 94 %.  General appearance: alert and no distress Neck: no adenopathy, no carotid bruit, no JVD, supple, symmetrical, trachea midline and thyroid not enlarged, symmetric, no tenderness/mass/nodules Lungs: clear to auscultation bilaterally Heart: regular rate and rhythm,  S1, S2 normal, no murmur, click, rub or gallop Extremities: extremities normal, atraumatic, no cyanosis or edema Pulses: 2+ and symmetric Skin: Skin color, texture, turgor normal. No rashes or lesions Neurologic: Alert and oriented X 3, normal strength and tone. Normal symmetric reflexes. Normal coordination and gait  EKG not performed today  ASSESSMENT AND PLAN:   Hypercholesterolemia History of hyperlipidemia not on statin therapy lipid profile performed 07/29/2019 revealed a total cholesterol 234, LDL 145 and HDL of 62.  She admits to dietary indiscretion.  She is going to alter her diet and we will recheck a lipid liver profile in 3 months.  I suspect that she will need statin therapy to achieve guideline goals for primary prevention.  Palpitations History of palpitations in the past with an event monitor revealing PACs and PVCs but these have since resolved.  She is on chronic low-dose beta-blocker.      Lorretta Harp MD FACP,FACC,FAHA, [facility] 03/02/2020 3:58 PM

## 2020-03-02 NOTE — Assessment & Plan Note (Signed)
History of hyperlipidemia not on statin therapy lipid profile performed 07/29/2019 revealed a total cholesterol 234, LDL 145 and HDL of 62.  She admits to dietary indiscretion.  She is going to alter her diet and we will recheck a lipid liver profile in 3 months.  I suspect that she will need statin therapy to achieve guideline goals for primary prevention.

## 2020-08-13 ENCOUNTER — Other Ambulatory Visit: Payer: Self-pay | Admitting: Internal Medicine

## 2020-08-13 DIAGNOSIS — Z1231 Encounter for screening mammogram for malignant neoplasm of breast: Secondary | ICD-10-CM

## 2020-08-27 ENCOUNTER — Ambulatory Visit
Admission: RE | Admit: 2020-08-27 | Discharge: 2020-08-27 | Disposition: A | Discharge status: Home / Self Care | Payer: Federal, State, Local not specified - PPO | Source: Ambulatory Visit | Attending: Internal Medicine | Admitting: Internal Medicine

## 2020-08-27 ENCOUNTER — Other Ambulatory Visit: Payer: Self-pay

## 2020-08-27 DIAGNOSIS — Z1231 Encounter for screening mammogram for malignant neoplasm of breast: Secondary | ICD-10-CM

## 2020-09-01 ENCOUNTER — Other Ambulatory Visit: Payer: Self-pay | Admitting: Internal Medicine

## 2020-09-01 DIAGNOSIS — R928 Other abnormal and inconclusive findings on diagnostic imaging of breast: Secondary | ICD-10-CM

## 2020-09-14 ENCOUNTER — Ambulatory Visit: Payer: Federal, State, Local not specified - PPO

## 2020-09-14 ENCOUNTER — Ambulatory Visit
Admission: RE | Admit: 2020-09-14 | Discharge: 2020-09-14 | Disposition: A | Discharge status: Home / Self Care | Payer: Federal, State, Local not specified - PPO | Source: Ambulatory Visit | Attending: Internal Medicine | Admitting: Internal Medicine

## 2020-09-14 ENCOUNTER — Other Ambulatory Visit: Payer: Self-pay

## 2020-09-14 DIAGNOSIS — R928 Other abnormal and inconclusive findings on diagnostic imaging of breast: Secondary | ICD-10-CM

## 2020-09-15 ENCOUNTER — Encounter (INDEPENDENT_AMBULATORY_CARE_PROVIDER_SITE_OTHER): Payer: Self-pay | Admitting: Otolaryngology

## 2020-09-15 ENCOUNTER — Ambulatory Visit (INDEPENDENT_AMBULATORY_CARE_PROVIDER_SITE_OTHER): Payer: Federal, State, Local not specified - PPO | Admitting: Otolaryngology

## 2020-09-15 VITALS — Temp 97.3°F

## 2020-09-15 DIAGNOSIS — R42 Dizziness and giddiness: Secondary | ICD-10-CM

## 2020-09-15 DIAGNOSIS — H8120 Vestibular neuronitis, unspecified ear: Secondary | ICD-10-CM

## 2020-09-15 NOTE — Progress Notes (Signed)
HPI: Jean Jennings is a 79 y.o. female who presents is referred by [facility] health care at [facility] for evaluation of vertigo and dizziness.  On discussion with patient she initially developed dizziness and vertigo in August as started in the middle of the night and persisted for several weeks.  She eventually was started on physical therapy for treatment of her dizziness and has gradually gotten much better. She has previously done yoga and Pilates and always had some problems with dizziness in certain positions when she performed her exercises. She wears hearing aids and has not noted any change in her hearing. She is doing much better compared to when she first had episodes back in August. 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  Past Medical History:  Diagnosis Date  . Anemia    as a younger woman  . Anxiety   . Arthritis   . Diarrhea last 3 to 4 months   stomach pain  . Dysrhythmia 3-4 yrs ago   hx irregular heart beat, dr Earl Gala manages  . GERD (gastroesophageal reflux disease)   . Hypothyroidism   . IBS (irritable bowel syndrome)   . PONV (postoperative nausea and vomiting)    " a little bit"   Past Surgical History:  Procedure Laterality Date  . ABDOMINAL HYSTERECTOMY  1970's  . arthroscopic surgery  oct 2012   left knee  . COLONOSCOPY WITH PROPOFOL N/A 06/03/2013   Procedure: COLONOSCOPY WITH PROPOFOL;  Surgeon: Charolett Bumpers, MD;  Location: [facility];  Service: Endoscopy;  Laterality: N/A;  . left wriist ligament surgery  sev yrs ago  . LUMBAR LAMINECTOMY/DECOMPRESSION MICRODISCECTOMY Left 06/09/2015   Procedure: Left Lumbar two-three Microdiskectomy;  Surgeon: Tressie Stalker, MD;  Location: [facility];  Service: Neurosurgery;  Laterality: Left;  Left L2-3 Microdiskectomy  . thryoid surgery  1963   partial  . TONSILLECTOMY     Social History   Socioeconomic History  . Marital status: Married    Spouse name: Not on file  . Number of children:  Not on file  . Years of education: Not on file  . Highest education level: Not on file  Occupational History  . Not on file  Tobacco Use  . Smoking status: Former Smoker    Packs/day: 0.25    Years: 3.00    Pack years: 0.75    Types: Cigarettes    Quit date: 12/11/1968    Years since quitting: 51.7  . Smokeless tobacco: Never Used  Vaping Use  . Vaping Use: Never used  Substance and Sexual Activity  . Alcohol use: Yes    Comment: occasional  . Drug use: No  . Sexual activity: Not on file  Other Topics Concern  . Not on file  Social History Narrative  . Not on file   Social Determinants of Health   Financial Resource Strain:   . Difficulty of Paying Living Expenses: Not on file  Food Insecurity:   . Worried About Programme researcher, broadcasting/film/video in the Last Year: Not on file  . Ran Out of Food in the Last Year: Not on file  Transportation Needs:   . Lack of Transportation (Medical): Not on file  . Lack of Transportation (Non-Medical): Not on file  Physical Activity:   . Days of Exercise per Week: Not on file  . Minutes of Exercise per Session: Not on file  Stress:   . Feeling of Stress : Not on file  Social Connections:   .  Frequency of Communication with Friends and Family: Not on file  . Frequency of Social Gatherings with Friends and Family: Not on file  . Attends Religious Services: Not on file  . Active Member of Clubs or Organizations: Not on file  . Attends Banker Meetings: Not on file  . Marital Status: Not on file   Family History  Problem Relation Age of Onset  . Liver cancer Mother   . Colon cancer Mother   . Bone cancer Father    Allergies  Allergen Reactions  . Codeine Nausea And Vomiting    Lightheaded also   Prior to Admission medications   Medication Sig Start Date End Date Taking? Authorizing Provider  Cholecalciferol (VITAMIN D) 2000 UNITS CAPS Take 1 capsule by mouth every evening.    Yes [provider]  citalopram (CELEXA) 10  MG tablet Take 5-10 mg by mouth every morning. Takes 5 mg one day and 10 mg  Next day alternates dosages   Yes [provider]  Influenza vac split quadrivalent PF (FLUZONE HIGH-DOSE) 0.5 ML injection ADM 0.5ML IM UTD 09/17/15  Yes [provider]  levothyroxine (SYNTHROID) 25 MCG tablet TK 1 T PO QD 04/28/19  Yes [provider]  levothyroxine (SYNTHROID, LEVOTHROID) 112 MCG tablet Take 112 mcg by mouth daily before breakfast.   Yes [provider]  naproxen (NAPROSYN) 250 MG tablet TK 1 TO 2 TS PO UP TO BID FOR RIGHT FOOT PAIN 04/15/19  Yes [provider]  PREMARIN 0.3 MG tablet Take 1 tablet by mouth daily. 07/30/17  Yes [provider]  Probiotic Product (PROBIOTIC DAILY PO) Take 1 capsule by mouth daily.   Yes [provider]  propranolol (INDERAL) 40 MG tablet Take 1 tablet by mouth 2 (two) times daily. 07/30/17  Yes [provider]  tretinoin (RETIN-A) 0.1 % cream APPLY A PEA SIZED AMOUNT TO FACE NIGHTLY AT BEDTIME AS DIRECTED 12/26/18  Yes [provider]     Positive ROS: Otherwise negative  All other systems have been reviewed and were otherwise negative with the exception of those mentioned in the HPI and as above.  Physical Exam: Constitutional: Alert, well-appearing, no acute distress Ears: External ears without lesions or tenderness. Ear canals are clear bilaterally.  Both TMs are clear with good mobility on pneumatic otoscopy.  On Dix-Hallpike testing in the office today she had no evidence of nystagmus or vertigo or clinical evidence of BPPV in the office. Nasal: External nose without lesions. Clear nasal passages Oral: Lips and gums without lesions. Tongue and palate mucosa without lesions. Posterior oropharynx clear. Neck: No palpable adenopathy or masses Respiratory: Breathing comfortably  Skin: No facial/neck lesions or rash noted.  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  Plan: Reviewed with patient concerning BPPV and I gave her information and treatment options for BPPV.  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 Also suggested taking a baby aspirin once a day as this could be secondary to poor inner ear circulation. If symptoms become bad again she will call us back to schedule VNG testing.  But overall she is doing much better presently.   Narda Bonds, MD   CC:

## 2020-11-22 ENCOUNTER — Other Ambulatory Visit (HOSPITAL_BASED_OUTPATIENT_CLINIC_OR_DEPARTMENT_OTHER): Payer: Self-pay | Admitting: Physician Assistant

## 2020-11-22 DIAGNOSIS — M25561 Pain in right knee: Secondary | ICD-10-CM

## 2020-11-27 ENCOUNTER — Other Ambulatory Visit: Payer: Self-pay

## 2020-11-27 ENCOUNTER — Ambulatory Visit (HOSPITAL_BASED_OUTPATIENT_CLINIC_OR_DEPARTMENT_OTHER)
Admission: RE | Admit: 2020-11-27 | Discharge: 2020-11-27 | Disposition: A | Discharge status: Home / Self Care | Payer: Federal, State, Local not specified - PPO | Source: Ambulatory Visit | Attending: Physician Assistant | Admitting: Physician Assistant

## 2020-11-27 DIAGNOSIS — M25561 Pain in right knee: Secondary | ICD-10-CM | POA: Insufficient documentation

## 2021-04-20 ENCOUNTER — Encounter: Payer: Self-pay | Admitting: Neurology

## 2021-04-20 ENCOUNTER — Ambulatory Visit: Payer: Federal, State, Local not specified - PPO | Admitting: Neurology

## 2021-04-20 ENCOUNTER — Telehealth: Payer: Self-pay | Admitting: Neurology

## 2021-04-20 VITALS — BP 122/82 | HR 57 | Ht 64.0 in | Wt 148.3 lb

## 2021-04-20 DIAGNOSIS — R2689 Other abnormalities of gait and mobility: Secondary | ICD-10-CM | POA: Diagnosis not present

## 2021-04-20 DIAGNOSIS — G250 Essential tremor: Secondary | ICD-10-CM | POA: Diagnosis not present

## 2021-04-20 DIAGNOSIS — R001 Bradycardia, unspecified: Secondary | ICD-10-CM | POA: Diagnosis not present

## 2021-04-20 DIAGNOSIS — R42 Dizziness and giddiness: Secondary | ICD-10-CM | POA: Diagnosis not present

## 2021-04-20 NOTE — Telephone Encounter (Signed)
Patient would like to go to [facility] [place]. Sent a message to [place] [facility] [place] [facility]. She will reach out to the patient to schedule.   [employer] fed no auth require.

## 2021-04-20 NOTE — Patient Instructions (Addendum)
You have a mild tremor of both hands. I do not see any signs or symptoms of parkinson's like disease or what we call parkinsonism.   For your tremor, I would not recommend any new medications.  Your lightheadedness and balance issues may be related to blood pressure fluctuations, your heart rate is also on the lower side, because of this bradycardia, which is probably, at least in part from your beta-blocker.  Certain side effects can be medication related.  Please remember, that any kind of tremor may be exacerbated by anxiety, stress, anger, nervousness, excitement, dehydration, sleep deprivation, by caffeine, thyroid disease, and low blood sugar values or blood sugar fluctuations. Some medications can exacerbate tremors, this includes antidepressant medications.  I would recommend a follow-up with your primary care physician.  I would recommend that you have additional blood work through them unless they have checked you recently for vitamin D deficiency, vitamin B12 deficiency and thyroid dysfunction.  We will do a brain scan, called MRI and call you with the test results. We will have to schedule you for this on a separate date. This test requires authorization from your insurance, and we will take care of the insurance process.  So long as your MRI shows age-appropriate and benign findings, we can have you follow-up in this clinic on an as-needed basis.

## 2021-04-20 NOTE — Progress Notes (Signed)
Subjective:    Patient ID: Jean Jennings is a 80 y.o. female.  HPI     Jean Foley, MD, PhD [facility] [address], [address] [address] [place], [place] [address]  Dear Jean Jennings,   I saw your patient, Jean Jennings, upon your kind request in my neurologic clinic today for initial consultation of her tremors.  The patient is unaccompanied today.  As you know, Jean Jennings is an 80 year old right-handed woman with an underlying medical history of arthritis, reflux disease, hypothyroidism, irritable bowel syndrome, anemia, anxiety, and borderline overweight state, who reports a longstanding history of hand tremors, particularly affecting her left hand of 20 years duration or longer.  She reports a recent emergence of internal tremors also affecting her head area or speech.  She reports that the symptoms occurred in the past week or so.  She also feels lightheaded at times but not necessarily when she stands up suddenly.  She feels that her balance is off.  She has not fallen, denies any sudden onset of one-sided weakness or numbness or tingling or droopy face or slurring of speech but overall feels weaker.  She has not gone to the emergency room or urgent care for acute or sudden symptoms.   Of note, she had been on a different beta-blocker, namely Toprol for PVCs and this was changed to propranolol some 2 years ago to help with her tremors.  She is currently taking 40 mg twice daily.  She used to be on Celexa but for the past 18 months has been on generic Lexapro.  She does not recall as to why exactly she was switched from citalopram.  She tries to hydrate well with water, estimates that she drinks around 6 cups of water per day.  Typically very limited caffeine, in the form of coffee 1 cup in the morning, decaf tea during the day.  I reviewed your office note from 04/12/2021.  She had blood work at the time which I was able to review: BMP showed benign findings, BUN 15, creatinine 0.9,  CBC with differential was largely with benign findings, urinalysis negative. She denies any significant stressors or anxiety, her most recent new medication was Jean Jennings which was held, she has not noticed any improvement in her symptoms since holding this medication. Sometimes she feels that her eye movements are slower.  She had a recent eye examination within the past month, she goes to [facility] ophthalmology.  She had cataract extraction some 2 years ago successfully.  She had right Jennings arthroscopic surgery in January 2022 with good success.   Her Past Medical History Is Significant For: Past Medical History:  Diagnosis Date  . Anemia    as a younger woman  . Anxiety   . Arthritis   . Diarrhea last 3 to 4 months   stomach pain  . Dysrhythmia 3-4 yrs ago   hx irregular heart beat, dr Jean Jennings manages  . GERD (gastroesophageal reflux disease)   . Hypothyroidism   . IBS (irritable bowel syndrome)   . PONV (postoperative nausea and vomiting)    " a little bit"    Her Past Surgical History Is Significant For: Past Surgical History:  Procedure Laterality Date  . ABDOMINAL HYSTERECTOMY  1970's  . arthroscopic surgery  oct 2012   left Jennings  . COLONOSCOPY WITH PROPOFOL N/A 06/03/2013   Procedure: COLONOSCOPY WITH PROPOFOL;  Surgeon: Jean Bumpers, MD;  Location: [facility];  Service: Endoscopy;  Laterality: N/A;  .  left wriist ligament surgery  sev yrs ago  . LUMBAR LAMINECTOMY/DECOMPRESSION MICRODISCECTOMY Left 06/09/2015   Procedure: Left Lumbar two-three Microdiskectomy;  Surgeon: Jean StalkerJeffrey Jenkins, MD;  Location: [facility];  Service: Neurosurgery;  Laterality: Left;  Left L2-3 Microdiskectomy  . thryoid surgery  1963   partial  . TONSILLECTOMY      Her Family History Is Significant For: Family History  Problem Relation Age of Onset  . Liver cancer Mother   . Colon cancer Mother   . Bone cancer Father     Her Social History Is Significant For: Social History    Socioeconomic History  . Marital status: Married    Spouse name: Not on file  . Number of children: Not on file  . Years of education: Not on file  . Highest education level: Not on file  Occupational History  . Not on file  Tobacco Use  . Smoking status: Former Smoker    Packs/day: 0.25    Years: 3.00    Pack years: 0.75    Types: Cigarettes    Quit date: 12/11/1968    Years since quitting: 52.3  . Smokeless tobacco: Never Used  Vaping Use  . Vaping Use: Never used  Substance and Sexual Activity  . Alcohol use: Yes    Comment: occasional  . Drug use: No  . Sexual activity: Not on file  Other Topics Concern  . Not on file  Social History Narrative  . Not on file   Social Determinants of Health   Financial Resource Strain: Not on file  Food Insecurity: Not on file  Transportation Needs: Not on file  Physical Activity: Not on file  Stress: Not on file  Social Connections: Not on file    Her Allergies Are:  Allergies  Allergen Reactions  . Codeine Nausea And Vomiting    Lightheaded also  :   Her Current Medications Are:  Outpatient Encounter Medications as of 04/20/2021  Medication Sig  . Cholecalciferol (VITAMIN D) 2000 UNITS CAPS Take 1 capsule by mouth every evening.   . Influenza vac split quadrivalent PF (FLUZONE HIGH-DOSE) 0.5 ML injection ADM 0.5ML IM UTD  . levothyroxine (SYNTHROID) 25 MCG tablet TK 1 T PO QD  . levothyroxine (SYNTHROID, LEVOTHROID) 112 MCG tablet Take 112 mcg by mouth daily before breakfast.  . Probiotic Product (PROBIOTIC DAILY PO) Take 1 capsule by mouth daily.  . propranolol (INDERAL) 40 MG tablet Take 1 tablet by mouth 2 (two) times daily.  Marland Kitchen. tretinoin (RETIN-A) 0.1 % cream APPLY A PEA SIZED AMOUNT TO FACE NIGHTLY AT BEDTIME AS DIRECTED  . [DISCONTINUED] citalopram (CELEXA) 10 MG tablet Take 5-10 mg by mouth every morning. Takes 5 mg one day and 10 mg  Next day alternates dosages (Patient not taking: Reported on 04/20/2021)  .  [DISCONTINUED] naproxen (NAPROSYN) 250 MG tablet TK 1 TO 2 TS PO UP TO BID FOR RIGHT FOOT PAIN  . [DISCONTINUED] PREMARIN 0.3 MG tablet Take 1 tablet by mouth daily.   No facility-administered encounter medications on file as of 04/20/2021.  :   Review of Systems:  Out of a complete 14 point review of systems, all are reviewed and negative with the exception of these symptoms as listed below: Review of Systems  Neurological:       Here for consult on worsening tremors. Pt reports she feels the tremors more internally than externally. Reports weakness and dizziness are present as well. Sts sx have been present for a few  weeks now.     Objective:  Neurological Exam  Physical Exam Physical Examination:   Vitals:   04/20/21 1105  BP: 122/82  Pulse: (!) 57  SpO2: 96%    General Examination: The patient is a very pleasant 80 y.o. female in no acute distress. She appears well-developed and well-nourished and well groomed.   HEENT: Normocephalic, atraumatic, pupils are equal, round and reactive to light and accommodation. Funduscopic exam is normal with sharp disc margins noted. Extraocular tracking is good without limitation to gaze excursion or nystagmus noted. Normal smooth pursuit is noted. Hearing is grossly intact. Tympanic membranes are clear bilaterally. Face is symmetric with normal facial animation and normal facial sensation. Speech is clear with no dysarthria noted. There is no hypophonia. There is no lip, neck/head, jaw or voice tremor. Neck is supple with full range of passive and active motion. There are no carotid bruits on auscultation. Oropharynx exam reveals: mild mouth dryness, adequate dental hygiene. Tongue protrudes centrally and palate elevates symmetrically.   Chest: Clear to auscultation without wheezing, rhonchi or crackles noted.  Heart: S1+S2+0, regular and normal without murmurs, rubs or gallops noted.   Abdomen: Soft, non-tender and  non-distended.  Extremities: There is no pitting edema in the distal lower extremities bilaterally. Pedal pulses are intact.  Skin: Warm and dry without trophic changes noted.  Musculoskeletal: exam reveals no obvious joint deformities, tenderness or joint swelling or erythema.   Neurologically:  Mental status: The patient is awake, alert and oriented in all 4 spheres. Her immediate and remote memory, attention, language skills and fund of knowledge are appropriate. There is no evidence of aphasia, agnosia, apraxia or anomia. Speech is clear with normal prosody and enunciation. Thought process is linear. Mood is normal and affect is normal.  Cranial nerves II - XII are as described above under HEENT exam. In addition: shoulder shrug is normal with equal shoulder height noted. Motor exam: Normal bulk, strength and tone is noted. There is no drift, resting tremor or rebound.  On 04/20/2021: Archimedes spiral drawing she has slight trembling with the left hand, there is slight trembling with the right hand, handwriting is legible, not particularly tremulous, not micrographic.  She has a very slight bilateral upper extremity postural and slight action tremor, no intention tremor, no resting tremor, no lower extremity tremors.  Reflexes are 1+ throughout. Babinski: Toes are flexor bilaterally. Fine motor skills and coordination: intact with normal finger taps, normal hand movements, normal rapid alternating patting, normal foot taps and normal foot agility.  Cerebellar testing: No dysmetria or intention tremor on finger to nose testing. There is no truncal or gait ataxia.  Sensory exam: intact to light touch, pinprick, vibration, temperature sense in the upper and lower extremities.  Gait, station and balance: She stands without difficulty.  She requires 2 assistance, she is able to stand narrow based.  She walks with preserved arm swing, no shuffling.  She is slightly insecure with turns.  She reports  mild lightheadedness.  Assessment and Plan:   In summary, Jean Jennings is a very pleasant 80 y.o.-year old female with an underlying medical history of arthritis, reflux disease, hypothyroidism, irritable bowel syndrome, anemia, anxiety, and borderline overweight state, who presents for evaluation of her tremors.  She has a longstanding history of hand tremors and lately, in the past 1+ week she has noticed trembling that she feels inside and also some balance issues as well as lightheadedness and dizziness, examination is not telltale for any  specific neurological underlying cause of her other symptoms, she does have a mild hand tremor and history and examination as well as family history supports the diagnosis of essential tremor.  She has been on a beta-blocker for this.  She does have bradycardia and lightheadedness and balance issues as well as dizziness, may tie in with certain side effects of her medications.  She is advised that there is no sign of parkinsonism.  She is furthermore advised to continue follow-up with your office to make sure there is no other cause for the bradycardia and her symptoms.  I reviewed your recent blood test results through your office.  She is furthermore advised to make sure that she had her thyroid function checked.  In addition, with the next blood work I recommend work-up for vitamin D and B12 deficiency.  From the neurological standpoint, I recommended we proceed with a brain MRI with and without contrast to rule out a structural cause of her trembling and her more nonspecific symptoms.  She does not have any obvious weakness or numbness on exam.  She is largely reassured in that regard.  We talked about the importance of healthy lifestyle, good nutrition, fall prevention.  She is advised that we will call her with her MRI results and follow-up in this clinic accordingly.  If her MRI shows benign and age-appropriate findings, she can follow-up in this clinic on an  as-needed basis.  I answered all her questions today and she was in agreement.   Thank you very much for allowing me to participate in the care of this nice patient. If I can be of any further assistance to you please do not hesitate to call me at [phone].  Sincerely,   Jean Foley, MD, PhD

## 2021-04-21 NOTE — Telephone Encounter (Signed)
[place] is [place] [facility].

## 2021-04-27 ENCOUNTER — Telehealth: Payer: Self-pay

## 2021-04-27 NOTE — Telephone Encounter (Signed)
Thank you for taking the call and I agree with Dr. Alphonsus Sias and Sue's recommendation to seek immediate evaluation in the emergency room should she have weakness or swallowing difficulty.  I appreciate the update.

## 2021-04-27 NOTE — Telephone Encounter (Signed)
Jean Modena, NP with Dr. Alphonsus Sias office called to given an update on the pt.   Jean Jennings states patient was seen in the office today for follow-up visit and reported new symptoms. Patient states that since her last visit with our office her gait has worsened, has had difficulty swallowing, and she is concerned about driving.  Jean Knee, NP advised multiple labs were completed during her visit today and results will be forwarded to our office.  Dr. 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  Medication interaction has been ruled out per Dr. Link Jennings.  NP and PCP did not note any progressive weakness. Jean Jennings advised the pt was told during her office visit today any new weakness, feeling like swallowing is worse and any new symptoms to seek care at the ER immediately.  To note, Jean Jennings states labs and notes from today's visit will be sent to our office once finalized.  Pt is also scheduled for MRI the 21 of this month. I advised Jean Jennings MRI report will be sent to their office once completed as well.

## 2021-04-28 ENCOUNTER — Telehealth: Payer: Self-pay | Admitting: Neurology

## 2021-04-28 MED ORDER — ALPRAZOLAM 0.25 MG PO TABS: ORAL_TABLET | ORAL | 0 refills | Status: DC

## 2021-04-28 NOTE — Addendum Note (Signed)
Addended by: Huston Foley on: 04/28/2021 03:14 PM   Modules accepted: Orders

## 2021-04-28 NOTE — Telephone Encounter (Signed)
I called pt and advised of Dr. Teofilo Pod recommendation and advised we have sent order to [facility].

## 2021-04-28 NOTE — Addendum Note (Signed)
Addended by: Ann Maki on: 04/28/2021 03:04 PM   Modules accepted: Orders

## 2021-04-28 NOTE — Telephone Encounter (Signed)
Pt is asking for something to be called in to calm her for her upcoming MRI

## 2021-04-28 NOTE — Telephone Encounter (Signed)
I have sent a prescription for Xanax for patient's upcoming MRI due to anxiety and claustrophobia reported.  Please advise patient that this is a sedating medication and that she could have dizziness and worsening balance which she already has at baseline.  She could be at higher fall risk after taking this even hours after she has taken this.  She cannot drive to or from the MRI.

## 2021-04-28 NOTE — Telephone Encounter (Signed)
Please review and sign if appropriate.

## 2021-04-30 ENCOUNTER — Ambulatory Visit (HOSPITAL_BASED_OUTPATIENT_CLINIC_OR_DEPARTMENT_OTHER)
Admission: RE | Admit: 2021-04-30 | Discharge: 2021-04-30 | Disposition: A | Discharge status: Home / Self Care | Payer: Federal, State, Local not specified - PPO | Source: Ambulatory Visit | Attending: Neurology | Admitting: Neurology

## 2021-04-30 ENCOUNTER — Other Ambulatory Visit: Payer: Self-pay

## 2021-04-30 DIAGNOSIS — G250 Essential tremor: Secondary | ICD-10-CM | POA: Diagnosis not present

## 2021-04-30 DIAGNOSIS — R2689 Other abnormalities of gait and mobility: Secondary | ICD-10-CM | POA: Insufficient documentation

## 2021-04-30 DIAGNOSIS — R001 Bradycardia, unspecified: Secondary | ICD-10-CM | POA: Diagnosis present

## 2021-04-30 DIAGNOSIS — R42 Dizziness and giddiness: Secondary | ICD-10-CM | POA: Diagnosis present

## 2021-04-30 MED ORDER — GADOBUTROL 1 MMOL/ML IV SOLN
7.0000 mL | Freq: Once | INTRAVENOUS | Status: AC | PRN
  Administered 2021-04-30: 7 mL via INTRAVENOUS

## 2021-05-02 ENCOUNTER — Telehealth: Payer: Self-pay

## 2021-05-02 NOTE — Telephone Encounter (Signed)
I called pt and reviewed results. She verbalized understanding and will continue f/u with PCP.  Results have also been sent to Dr. Link Snuffer.

## 2021-05-02 NOTE — Telephone Encounter (Signed)
-----   Message from Huston Foley, MD sent at 05/02/2021 10:24 AM EDT ----- Please call patient and advise her that her brain MRI did not show any acute findings, no abnormal contrast uptake.  Some chronic changes were seen, no structural abnormality, no cause for her tremors.  She can follow-up with her primary care at this time.

## 2021-05-02 NOTE — Progress Notes (Signed)
Please call patient and advise her that her brain MRI did not show any acute findings, no abnormal contrast uptake.  Some chronic changes were seen, no structural abnormality, no cause for her tremors.  She can follow-up with her primary care at this time.

## 2021-05-27 ENCOUNTER — Other Ambulatory Visit: Payer: Self-pay

## 2021-05-27 ENCOUNTER — Encounter (HOSPITAL_COMMUNITY): Payer: Self-pay | Admitting: *Deleted

## 2021-05-27 ENCOUNTER — Emergency Department (HOSPITAL_COMMUNITY): Payer: Federal, State, Local not specified - PPO

## 2021-05-27 ENCOUNTER — Emergency Department (HOSPITAL_COMMUNITY)
Admission: EM | Admit: 2021-05-27 | Discharge: 2021-05-28 | Disposition: A | Discharge status: Home / Self Care | Payer: Federal, State, Local not specified - PPO | Attending: Emergency Medicine | Admitting: Emergency Medicine

## 2021-05-27 DIAGNOSIS — R0602 Shortness of breath: Secondary | ICD-10-CM | POA: Diagnosis not present

## 2021-05-27 DIAGNOSIS — Z5321 Procedure and treatment not carried out due to patient leaving prior to being seen by health care provider: Secondary | ICD-10-CM | POA: Insufficient documentation

## 2021-05-27 LAB — COMPREHENSIVE METABOLIC PANEL
ALT: 13 U/L (ref 0–44)
AST: 18 U/L (ref 15–41)
Albumin: 4.2 g/dL (ref 3.5–5.0)
Alkaline Phosphatase: 50 U/L (ref 38–126)
Anion gap: 9 (ref 5–15)
BUN: 12 mg/dL (ref 8–23)
CO2: 28 mmol/L (ref 22–32)
Calcium: 9.9 mg/dL (ref 8.9–10.3)
Chloride: 102 mmol/L (ref 98–111)
Creatinine, Ser: 0.84 mg/dL (ref 0.44–1.00)
GFR, Estimated: >60 mL/min (ref >60)
Glucose, Bld: 89 mg/dL (ref 70–99)
Potassium: 4.1 mmol/L (ref 3.5–5.1)
Sodium: 139 mmol/L (ref 135–145)
Total Bilirubin: 0.6 mg/dL (ref 0.3–1.2)
Total Protein: 7.5 g/dL (ref 6.5–8.1)

## 2021-05-27 LAB — CBC WITH DIFFERENTIAL/PLATELET
Abs Immature Granulocytes: 0.01 10*3/uL (ref 0.00–0.07)
Basophils Absolute: 0.1 10*3/uL (ref 0.0–0.1)
Basophils Relative: 1 %
Eosinophils Absolute: 0.3 10*3/uL (ref 0.0–0.5)
Eosinophils Relative: 5 %
HCT: 39.9 % (ref 36.0–46.0)
Hemoglobin: 13.1 g/dL (ref 12.0–15.0)
Immature Granulocytes: 0 %
Lymphocytes Relative: 49 %
Lymphs Abs: 3.1 10*3/uL (ref 0.7–4.0)
MCH: 31.1 pg (ref 26.0–34.0)
MCHC: 32.8 g/dL (ref 30.0–36.0)
MCV: 94.8 fL (ref 80.0–100.0)
Monocytes Absolute: 0.6 10*3/uL (ref 0.1–1.0)
Monocytes Relative: 9 %
Neutro Abs: 2.3 10*3/uL (ref 1.7–7.7)
Neutrophils Relative %: 36 %
Platelets: 190 10*3/uL (ref 150–400)
RBC: 4.21 MIL/uL (ref 3.87–5.11)
RDW: 12.1 % (ref 11.5–15.5)
WBC: 6.3 10*3/uL (ref 4.0–10.5)
nRBC: 0 % (ref 0.0–0.2)

## 2021-05-27 LAB — TROPONIN I (HIGH SENSITIVITY)
Troponin I (High Sensitivity): 9 ng/L (ref <18)
Troponin I (High Sensitivity): 10 ng/L (ref <18)

## 2021-05-27 NOTE — ED Triage Notes (Signed)
The pt is c/o sob and has been being checked for a neuri muscular  doe several months  hx of anxiety   sob when she lies down

## 2021-05-28 NOTE — ED Notes (Signed)
Pt left with husband very upset due to not Being seen quick enough. Husband states they will not be returning ever

## 2021-06-22 ENCOUNTER — Other Ambulatory Visit: Payer: Self-pay

## 2021-06-22 ENCOUNTER — Encounter (HOSPITAL_BASED_OUTPATIENT_CLINIC_OR_DEPARTMENT_OTHER): Payer: Self-pay

## 2021-06-22 ENCOUNTER — Emergency Department (HOSPITAL_BASED_OUTPATIENT_CLINIC_OR_DEPARTMENT_OTHER)
Admission: EM | Admit: 2021-06-22 | Discharge: 2021-06-22 | Disposition: A | Discharge status: Home / Self Care | Payer: Federal, State, Local not specified - PPO | Attending: Emergency Medicine | Admitting: Emergency Medicine

## 2021-06-22 DIAGNOSIS — Z87891 Personal history of nicotine dependence: Secondary | ICD-10-CM | POA: Diagnosis not present

## 2021-06-22 DIAGNOSIS — Z79899 Other long term (current) drug therapy: Secondary | ICD-10-CM | POA: Diagnosis not present

## 2021-06-22 DIAGNOSIS — K5900 Constipation, unspecified: Secondary | ICD-10-CM | POA: Diagnosis present

## 2021-06-22 DIAGNOSIS — K5641 Fecal impaction: Secondary | ICD-10-CM | POA: Diagnosis not present

## 2021-06-22 DIAGNOSIS — E039 Hypothyroidism, unspecified: Secondary | ICD-10-CM | POA: Diagnosis not present

## 2021-06-22 HISTORY — DX: Myasthenia gravis without (acute) exacerbation: G70.00

## 2021-06-22 NOTE — ED Triage Notes (Signed)
Pt c/o no BM x 4 days-NAD- to triage in w/c

## 2021-06-22 NOTE — ED Notes (Signed)
Provider performed digitial disimpaction. Patient no longer has the feeling of pressure in the rectum.

## 2021-06-22 NOTE — ED Provider Notes (Signed)
[facility] EMERGENCY DEPARTMENT Provider Note   CSN: [id number] Arrival date & time: 06/22/21  1345     History Chief Complaint  Patient presents with   Constipation    Jean Jennings is a 81 y.o. female.  HPI Patient is an 80 year old female with past medical history significant for myasthenia gravis currently on high-dose steroids was admitted at [facility] and discharged 7/5.  She states since that time she has been having some constipation and decreased bowel movements.  She states she normally has 2 gallons per day has not pooped in 4 days.  She states that she received IVIG without complication during her hospital stay.  I reviewed EMR it appears that she was seen by SLP and cleared.  Patient states that she has not had any balance of the last 4 days.  She has continued to have a little bit of overflow diarrhea she states.  She feels that she has the need to poop but states that when she attempts to she is unable to.  She feels that there is a hard object in her rectum.  She feels that she has minimal abdominal pain and more pressure in her lower abdomen/pelvis.  She denies any vaginal discharge vaginal bleeding she denies any urinary frequency urgency or dysuria.   No chest pain or shortness of breath.  No significant fatigue.    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   Hot flashes 08/19/2018   Hypercholesterolemia 08/19/2018    Hypothyroid 08/19/2018   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   Palpitations 08/19/2018   Tremor 08/19/2018   Vertigo 08/19/2018   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    Past Surgical History:  Procedure Laterality Date   ABDOMINAL HYSTERECTOMY  1970's   arthroscopic surgery  oct 2012   left knee   COLONOSCOPY WITH PROPOFOL N/A 06/03/2013   Procedure: COLONOSCOPY WITH PROPOFOL;  Surgeon: Charolett Bumpers, MD;  Location: [facility];  Service: Endoscopy;  Laterality: N/A;   left wriist ligament surgery  sev yrs ago   LUMBAR LAMINECTOMY/DECOMPRESSION MICRODISCECTOMY Left 06/09/2015   Procedure: Left Lumbar two-three Microdiskectomy;  Surgeon: Tressie Stalker, MD;  Location: [facility];  Service: Neurosurgery;  Laterality: Left;  Left L2-3 Microdiskectomy   thryoid surgery  1963   partial   TONSILLECTOMY       OB History   No obstetric history on file.     Family History  Problem Relation Age of Onset   Liver cancer Mother    Colon cancer Mother    Bone cancer Father     Social History   Tobacco Use   Smoking status: Former    Packs/day: 0.25    Years: 3.00    Pack years: 0.75    Types: Cigarettes  Quit date: 12/11/1968    Years since quitting: 52.5   Smokeless tobacco: Never  Vaping Use   Vaping Use: Never used  Substance Use Topics   Alcohol use: Yes    Comment: occasional   Drug use: No    Home Medications Prior to Admission medications   Medication Sig Start Date End Date Taking? Authorizing Provider  ALPRAZolam Prudy Feeler) 0.25 MG tablet For MRI- Take 1-2 30-45 minutes before MRI, additional tablet can be taken at the time of the scan 04/28/21   Huston Foley, MD  Cholecalciferol (VITAMIN D) 2000 UNITS CAPS Take 1 capsule by mouth every evening.     [provider]  Influenza vac split quadrivalent PF (FLUZONE HIGH-DOSE) 0.5 ML injection ADM 0.5ML IM UTD 09/17/15   [provider]  levothyroxine  (SYNTHROID) 25 MCG tablet TK 1 T PO QD 04/28/19   [provider]  levothyroxine (SYNTHROID, LEVOTHROID) 112 MCG tablet Take 112 mcg by mouth daily before breakfast.    [provider]  Probiotic Product (PROBIOTIC DAILY PO) Take 1 capsule by mouth daily.    [provider]  propranolol (INDERAL) 40 MG tablet Take 1 tablet by mouth 2 (two) times daily. 07/30/17   [provider]  tretinoin (RETIN-A) 0.1 % cream APPLY A PEA SIZED AMOUNT TO FACE NIGHTLY AT BEDTIME AS DIRECTED 12/26/18   [provider]    Allergies    Codeine  Review of Systems   Review of Systems  Constitutional:  Negative for chills and fever.  HENT:  Negative for congestion.   Eyes:  Negative for pain.  Respiratory:  Negative for cough and shortness of breath.   Cardiovascular:  Negative for chest pain and leg swelling.  Gastrointestinal:  Negative for abdominal pain, nausea and vomiting.       Constipation   Genitourinary:  Negative for dysuria.  Musculoskeletal:  Negative for myalgias.  Skin:  Negative for rash.  Neurological:  Negative for dizziness and headaches.   Physical Exam Updated Vital Signs BP (!) 154/72 (BP Location: Right Arm)   Pulse 92   Temp 98 F (36.7 C) (Oral)   Resp 20   SpO2 95%   Physical Exam Vitals and nursing note reviewed.  Constitutional:      General: She is not in acute distress.    Comments: Well-appearing 80 year old female pleasant, able answer questions and follow commands  HENT:     Head: Normocephalic and atraumatic.     Nose: Nose normal.  Eyes:     General: No scleral icterus. Cardiovascular:     Rate and Rhythm: Normal rate and regular rhythm.     Pulses: Normal pulses.     Heart sounds: Normal heart sounds.  Pulmonary:     Effort: Pulmonary effort is normal. No respiratory distress.     Breath sounds: No wheezing.  Abdominal:     Palpations: Abdomen is soft.     Tenderness: There is no abdominal tenderness.      Comments: Abdomen without any tenderness.  There are small areas of bruising from Lovenox injections from recent hospitalization.  Genitourinary:    Comments: Large stool ball in rectal vault Musculoskeletal:     Cervical back: Normal range of motion.     Right lower leg: No edema.     Left lower leg: No edema.  Skin:    General: Skin is warm and dry.     Capillary Refill: Capillary refill takes less than 2 seconds.  Neurological:  Mental Status: She is alert. Mental status is at baseline.  Psychiatric:        Mood and Affect: Mood normal.        Behavior: Behavior normal.    ED Results / Procedures / Treatments   Labs (all labs ordered are listed, but only abnormal results are displayed) Labs Reviewed - No data to display  EKG None  Radiology No results found.  Procedures Fecal disimpaction  Date/Time: 06/22/2021 3:38 PM Performed by: Gailen Shelter, PA Authorized by: Gailen Shelter, PA  Consent: Verbal consent obtained. Risks and benefits: risks, benefits and alternatives were discussed Consent given by: patient Patient understanding: patient states understanding of the procedure being performed Patient consent: the patient's understanding of the procedure matches consent given Relevant documents: relevant documents present and verified Test results: test results available and properly labeled Imaging studies: imaging studies available Patient identity confirmed: verbally with patient and arm band Local anesthesia used: no  Anesthesia: Local anesthesia used: no  Sedation: Patient sedated: no  Patient tolerance: patient tolerated the procedure well with no immediate complications Comments: Stool ball broken up and removed -- pt tolerated well.      Medications Ordered in ED Medications - No data to display  ED Course  I have reviewed the triage vital signs and the nursing notes.  Pertinent labs & imaging results that were available during my care  of the patient were reviewed by me and considered in my medical decision making (see chart for details).    MDM Rules/Calculators/A&P                          Patient here with fecal impaction.  She states she has been constipated for the past 4 days has not had a solid bowel movement has had a little bit of overflow diarrhea.  Denies any abdominal pain chest pressure.  Physical exam is benign.  Patient is overall well-appearing does have a large stool ball which was disimpacted patient tolerated this procedure well.  Reevaluated abdomen after patient finished pooping after fecal impaction disimpaction.  Patient continues to have soft nontender abdomen.  Discussed with attending physician Dr. Madilyn Hook prior to discharge.  Patient given very strict return precautions.  She is understanding of plan.  Agreeable to discharge instructions and has follow-up appointment on Monday for PCP.  Final Clinical Impression(s) / ED Diagnoses Final diagnoses:  Fecal impaction [facility])    Rx / DC Orders ED Discharge Orders     None        Gailen Shelter, [place] 06/22/21 1541    Tilden Fossa, MD 06/22/21 1643

## 2021-06-22 NOTE — Discharge Instructions (Addendum)
Please keep your appointment with your primary care provider on Monday.  Please closely follow-up with your neurologist as we discussed.  Also please return to the ER for any new or concerning symptom specifically any fevers, worsening weakness, abdominal pain, vomiting.  Please drink plenty of water.

## 2021-06-27 ENCOUNTER — Encounter (HOSPITAL_COMMUNITY): Payer: Self-pay

## 2021-06-27 ENCOUNTER — Other Ambulatory Visit: Payer: Self-pay

## 2021-06-27 ENCOUNTER — Emergency Department (HOSPITAL_COMMUNITY): Payer: Federal, State, Local not specified - PPO

## 2021-06-27 ENCOUNTER — Emergency Department (HOSPITAL_COMMUNITY)
Admission: EM | Admit: 2021-06-27 | Discharge: 2021-06-27 | Disposition: A | Discharge status: Home / Self Care | Payer: Federal, State, Local not specified - PPO | Attending: Emergency Medicine | Admitting: Emergency Medicine

## 2021-06-27 DIAGNOSIS — Z79899 Other long term (current) drug therapy: Secondary | ICD-10-CM | POA: Insufficient documentation

## 2021-06-27 DIAGNOSIS — E039 Hypothyroidism, unspecified: Secondary | ICD-10-CM | POA: Diagnosis not present

## 2021-06-27 DIAGNOSIS — R0789 Other chest pain: Secondary | ICD-10-CM | POA: Diagnosis not present

## 2021-06-27 DIAGNOSIS — Z87891 Personal history of nicotine dependence: Secondary | ICD-10-CM | POA: Insufficient documentation

## 2021-06-27 DIAGNOSIS — R002 Palpitations: Secondary | ICD-10-CM | POA: Insufficient documentation

## 2021-06-27 DIAGNOSIS — N3000 Acute cystitis without hematuria: Secondary | ICD-10-CM

## 2021-06-27 DIAGNOSIS — R0602 Shortness of breath: Secondary | ICD-10-CM | POA: Insufficient documentation

## 2021-06-27 DIAGNOSIS — B9689 Other specified bacterial agents as the cause of diseases classified elsewhere: Secondary | ICD-10-CM | POA: Diagnosis not present

## 2021-06-27 DIAGNOSIS — R011 Cardiac murmur, unspecified: Secondary | ICD-10-CM | POA: Insufficient documentation

## 2021-06-27 LAB — URINALYSIS, ROUTINE W REFLEX MICROSCOPIC
Bilirubin Urine: NEGATIVE
Glucose, UA: NEGATIVE mg/dL
Ketones, ur: NEGATIVE mg/dL
Nitrite: NEGATIVE
Protein, ur: NEGATIVE mg/dL
Specific Gravity, Urine: 1.009 (ref 1.005–1.030)
pH: 7 (ref 5.0–8.0)

## 2021-06-27 LAB — MAGNESIUM: Magnesium: 2.4 mg/dL (ref 1.7–2.4)

## 2021-06-27 LAB — TROPONIN I (HIGH SENSITIVITY)
Troponin I (High Sensitivity): 24 ng/L — ABNORMAL HIGH (ref <18)
Troponin I (High Sensitivity): 22 ng/L — ABNORMAL HIGH (ref <18)

## 2021-06-27 LAB — HEPATIC FUNCTION PANEL
ALT: 25 U/L (ref 0–44)
AST: 24 U/L (ref 15–41)
Albumin: 3.5 g/dL (ref 3.5–5.0)
Alkaline Phosphatase: 46 U/L (ref 38–126)
Bilirubin, Direct: 0.1 mg/dL (ref 0.0–0.2)
Indirect Bilirubin: 0.6 mg/dL (ref 0.3–0.9)
Total Bilirubin: 0.7 mg/dL (ref 0.3–1.2)
Total Protein: 7.7 g/dL (ref 6.5–8.1)

## 2021-06-27 LAB — CBC
HCT: 40.9 % (ref 36.0–46.0)
Hemoglobin: 13.5 g/dL (ref 12.0–15.0)
MCH: 30.8 pg (ref 26.0–34.0)
MCHC: 33 g/dL (ref 30.0–36.0)
MCV: 93.2 fL (ref 80.0–100.0)
Platelets: 233 10*3/uL (ref 150–400)
RBC: 4.39 MIL/uL (ref 3.87–5.11)
RDW: 12.7 % (ref 11.5–15.5)
WBC: 6.8 10*3/uL (ref 4.0–10.5)
nRBC: 0 % (ref 0.0–0.2)

## 2021-06-27 LAB — BASIC METABOLIC PANEL
Anion gap: 8 (ref 5–15)
BUN: 14 mg/dL (ref 8–23)
CO2: 27 mmol/L (ref 22–32)
Calcium: 9.8 mg/dL (ref 8.9–10.3)
Chloride: 98 mmol/L (ref 98–111)
Creatinine, Ser: 0.74 mg/dL (ref 0.44–1.00)
GFR, Estimated: >60 mL/min (ref >60)
Glucose, Bld: 127 mg/dL — ABNORMAL HIGH (ref 70–99)
Potassium: 4.1 mmol/L (ref 3.5–5.1)
Sodium: 133 mmol/L — ABNORMAL LOW (ref 135–145)

## 2021-06-27 LAB — TSH: TSH: 0.271 u[IU]/mL — ABNORMAL LOW (ref 0.350–4.500)

## 2021-06-27 MED ORDER — CEPHALEXIN 500 MG PO CAPS: 500.0000 mg | ORAL_CAPSULE | Freq: Two times a day (BID) | ORAL | 0 refills | Status: AC

## 2021-06-27 MED ORDER — ATENOLOL 25 MG PO TABS: 25.0000 mg | ORAL_TABLET | Freq: Every day | ORAL | 0 refills | Status: DC

## 2021-06-27 NOTE — ED Triage Notes (Signed)
Patient reports that she had propanolol discontinued by her neurologist.Patient was taking for Myasthenia Gravis.  Patient began having PVC's at home and states that she was unable to sleep. Patient called her physician and was told to come to the ED. Patient states she did not feel like she was having any PVC's while on the way to the ED today.

## 2021-06-27 NOTE — Discharge Instructions (Addendum)
As we discussed, your work-up today was overall reassuring but we did find evidence of urinary tract infection that could be contributing to your palpitations.  I had discussions with pharmacy and neurology who feel that atenolol is likely the safest medication to help with your palpitations and not precipitate myasthenia complications.  Please try this medication and follow-up closely with your neurology team, cardiology team, and PCP.  Please rest and stay hydrated.  Please treat the UTI.  If any symptoms change or worsen, please return to the nearest emergency department immediately.

## 2021-06-27 NOTE — ED Notes (Signed)
Pt refused labs until she gets a room

## 2021-06-27 NOTE — ED Provider Notes (Signed)
Emergency Medicine Provider Triage Evaluation Note  Jean Jennings , a 80 y.o. female  was evaluated in triage.  Pt complains of who presents with concern for palpitations for several days since she has discontinued her propranolol.  It was discontinued by her neurologist due to its exacerbation of her myasthenia gravis symptoms.  Patient is currently undergoing treatment for myasthenia gravis at [facility].  States that she was initially placed on propranolol to treat PVCs.  States that she has been experiencing the interrupt her sleep and her very uncomfortable" shortness of breath since stopping the propranolol.  Review of Systems  Positive: Palpitations, shortness of breath Negative: Chest pain, syncope, nausea, vomiting  Physical Exam  BP 115/68 (BP Location: Left Arm)   Pulse 94   Temp 98.2 F (36.8 C) (Oral)   Resp 17   Ht 5\' 4"  (1.626 m)   Wt 60.8 kg   SpO2 97%   BMI 23.00 kg/m  Gen:   Awake, no distress   Resp:  Normal effort  MSK:   Moves extremities without difficulty  Other:  RRR no M/R/G.  Medical Decision Making  Medically screening exam initiated at 4:57 PM.  Appropriate orders placed.  Jean Jennings was informed that the remainder of the evaluation will be completed by another provider, this initial triage assessment does not replace that evaluation, and the importance of remaining in the ED until their evaluation is complete.  Patient preference to hold off on blood work in triage as she has undergone significant testing at [facility] with discharge from the hospital on 06/19/2021.  She prefers that evaluating ED provider review her records prior to proceeding with laboratory studies today.  She also declined chest x-ray at this time.  This chart was dictated using voice recognition software, Dragon. Despite the best efforts of this provider to proofread and correct errors, errors may still occur which can change documentation meaning.    08/20/2021,  PA-C 06/27/21 1659    [place], 06/29/21, MD 06/27/21 1718

## 2021-06-27 NOTE — ED Provider Notes (Signed)
[facility]-EMERGENCY DEPT Provider Note   CSN: [id number] Arrival date & time: 06/27/21  1604     History No chief complaint on file.   Jean Jennings is a 80 y.o. female.  The history is provided by the patient, the spouse and medical records. No language interpreter was used.  Palpitations Palpitations quality:  Irregular Onset quality:  Gradual Duration:  4 days Timing:  Constant Progression:  Worsening Chronicity:  Chronic Context comment:  Stopped beta blocker and started steroids Relieved by:  Nothing Worsened by:  Nothing Ineffective treatments:  None tried Associated symptoms: chest pain, chest pressure and shortness of breath   Associated symptoms: no back pain, no cough, no diaphoresis, no lower extremity edema, no malaise/fatigue, no nausea, no near-syncope, no numbness, no vomiting and no weakness       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   Hot flashes 08/19/2018   Hypercholesterolemia 08/19/2018   Hypothyroid 08/19/2018   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   Palpitations 08/19/2018   Tremor 08/19/2018   Vertigo 08/19/2018   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    Past Surgical History:  Procedure Laterality Date   ABDOMINAL HYSTERECTOMY  1970's   arthroscopic surgery  oct 2012   left knee   COLONOSCOPY WITH PROPOFOL N/A 06/03/2013   Procedure: COLONOSCOPY WITH  PROPOFOL;  Surgeon: Charolett Bumpers, MD;  Location: [facility];  Service: Endoscopy;  Laterality: N/A;   left wriist ligament surgery  sev yrs ago   LUMBAR LAMINECTOMY/DECOMPRESSION MICRODISCECTOMY Left 06/09/2015   Procedure: Left Lumbar two-three Microdiskectomy;  Surgeon: Tressie Stalker, MD;  Location: [facility];  Service: Neurosurgery;  Laterality: Left;  Left L2-3 Microdiskectomy   thryoid surgery  1963   partial   TONSILLECTOMY       OB History   No obstetric history on file.     Family History  Problem Relation Age of Onset   Liver cancer Mother    Colon cancer Mother    Bone cancer Father     Social History   Tobacco Use   Smoking status: Former    Packs/day: 0.25    Years: 3.00    Pack years: 0.75    Types: Cigarettes    Quit date: 12/11/1968    Years since quitting: 52.5   Smokeless tobacco: Never  Vaping Use   Vaping Use: Never used  Substance Use Topics   Alcohol use: Yes    Comment: occasional   Drug use: No    Home Medications Prior to Admission medications   Medication Sig Start Date End Date Taking? Authorizing Provider  ALPRAZolam Prudy Feeler) 0.25 MG tablet For MRI- Take 1-2 30-45 minutes before MRI, additional tablet can be taken at the time of the scan 04/28/21   Huston Foley, MD  Cholecalciferol (VITAMIN D) 2000 UNITS CAPS Take 1 capsule by mouth every evening.     [provider]  Influenza vac split quadrivalent PF (FLUZONE HIGH-DOSE) 0.5 ML injection ADM 0.5ML IM UTD 09/17/15   [provider]  levothyroxine (SYNTHROID) 25 MCG tablet TK 1 T PO QD 04/28/19   [provider]  levothyroxine (SYNTHROID, LEVOTHROID) 112 MCG tablet Take 112 mcg by mouth daily before breakfast.    [provider]  Probiotic Product (PROBIOTIC DAILY PO) Take 1 capsule by mouth daily.    [provider]  propranolol (INDERAL) 40 MG tablet Take 1 tablet by mouth 2 (two) times daily. 07/30/17   [provider]  tretinoin  (RETIN-A) 0.1 % cream APPLY A PEA SIZED AMOUNT TO FACE NIGHTLY AT BEDTIME AS DIRECTED 12/26/18   [provider]    Allergies    Codeine  Review of Systems   Review of Systems  Constitutional:  Negative for chills, diaphoresis, fatigue, fever and malaise/fatigue.  HENT:  Negative for congestion.   Respiratory:  Positive for chest tightness and shortness of breath. Negative for cough and wheezing.   Cardiovascular:  Positive for chest pain and palpitations. Negative for leg swelling and near-syncope.  Gastrointestinal:  Negative for abdominal pain, constipation (resolved), diarrhea, nausea and vomiting.  Genitourinary:  Positive for dysuria.  Musculoskeletal:  Negative for back pain.  Skin:  Negative for wound.  Neurological:  Negative for weakness, light-headedness and numbness.  Psychiatric/Behavioral:  Negative for agitation.   All other systems reviewed and are negative.  Physical Exam Updated Vital Signs BP 115/68 (BP Location: Left Arm)   Pulse 94   Temp 98.2 F (36.8 C) (Oral)   Resp 17   Ht 5\' 4"  (1.626 m)   Wt 60.8 kg   SpO2 97%   BMI 23.00 kg/m   Physical Exam Vitals and nursing note reviewed.  Constitutional:      General: She is not in acute distress.    Appearance: She is well-developed. She is not ill-appearing, toxic-appearing or diaphoretic.  HENT:     Head: Normocephalic and atraumatic.     Mouth/Throat:     Mouth: Mucous membranes are moist.  Eyes:     Conjunctiva/sclera: Conjunctivae normal.  Cardiovascular:     Rate and Rhythm: Normal rate and regular rhythm.     Heart sounds: Murmur heard.  Pulmonary:     Effort: Pulmonary effort is normal. No respiratory distress.     Breath sounds: Normal breath sounds. No wheezing, rhonchi or rales.  Chest:     Chest wall: No tenderness.  Abdominal:     General: Abdomen is flat.     Palpations: Abdomen is soft.     Tenderness: There is no abdominal tenderness. There is no guarding or rebound.   Musculoskeletal:        General: No tenderness.     Cervical back: Neck supple. No tenderness.     Right lower leg: No edema.     Left lower leg: No edema.  Skin:    General: Skin is warm and dry.     Capillary Refill: Capillary refill takes less than 2 seconds.     Findings: No erythema.  Neurological:     General: No focal deficit present.     Mental Status: She is alert.     Sensory: No sensory deficit.     Motor: No weakness.  Psychiatric:        Mood and Affect: Mood normal.  ED Results / Procedures / Treatments   Labs (all labs ordered are listed, but only abnormal results are displayed) Labs Reviewed  BASIC METABOLIC PANEL - Abnormal; Notable for the following components:      Result Value   Sodium 133 (*)    Glucose, Bld 127 (*)    All other components within normal limits  TSH - Abnormal; Notable for the following components:   TSH 0.271 (*)    All other components within normal limits  URINALYSIS, ROUTINE W REFLEX MICROSCOPIC - Abnormal; Notable for the following components:   Color, Urine STRAW (*)    Hgb urine dipstick SMALL (*)    Leukocytes,Ua TRACE (*)    Bacteria, UA RARE (*)    All other components within normal limits  TROPONIN I (HIGH SENSITIVITY) - Abnormal; Notable for the following components:   Troponin I (High Sensitivity) 24 (*)    All other components within normal limits  TROPONIN I (HIGH SENSITIVITY) - Abnormal; Notable for the following components:   Troponin I (High Sensitivity) 22 (*)    All other components within normal limits  URINE CULTURE  CBC  MAGNESIUM  HEPATIC FUNCTION PANEL    EKG EKG Interpretation  Date/Time:  Monday June 27 2021 16:18:37 EDT Ventricular Rate:  98 PR Interval:  116 QRS Duration: 83 QT Interval:  346 QTC Calculation: 442 R Axis:   47 Text Interpretation: Sinus rhythm Borderline short PR interval Biatrial enlargement Borderline ST depression, diffuse leads 12 Lead; Mason-Likar When compared to  prior, no significant changes seen. No STEMI Confirmed by Theda Belfast ([id number]) on 06/27/2021 6:31:35 PM  Radiology DG Chest 2 View  Result Date: 06/27/2021 CLINICAL DATA:  Palpitations. EXAM: CHEST - 2 VIEW COMPARISON:  Chest x-ray 05/27/2021, CT cardiac tends 09/25/19. FINDINGS: The heart size and mediastinal contours are unchanged. Atherosclerotic plaque of the aorta. Similar-appearing oval 1.8 cm right lobe calcified pulmonary nodule. No focal consolidation. No pulmonary edema. No pleural effusion. No pneumothorax. No acute osseous abnormality. Degenerative changes of the acromioclavicular joints. IMPRESSION: No active cardiopulmonary disease. Electronically Signed   By: Tish Frederickson M.D.   On: 06/27/2021 17:36    Procedures Procedures   Medications Ordered in ED Medications - No data to display  ED Course  I have reviewed the triage vital signs and the nursing notes.  Pertinent labs & imaging results that were available during my care of the patient were reviewed by me and considered in my medical decision making (see chart for details).    MDM Rules/Calculators/A&P                          Jean Jennings is a very pleasant 80 y.o. female with a past medical history significant for recent diagnosis of myasthenia gravis, hypothyroidism, GERD, and chronic palpitations previously on propranolol who presents with significant palpitations causing chest discomfort and lack of sleep.  Patient reports that she has had palpitations for years and was on propranolol with complete management of her symptoms.  She reports that last last week she was discharged from [facility] where she was diagnosed with myasthenia gravis and they told her to discontinue the propranolol and start steroids.  She says that several days after discharge, she started having palpitations again that was significantly preventing her from sleeping as she feels a pressure-like discomfort that comes and goes in  her chest.  She reports some mild shortness of breath with it  and some discomfort but it is not consistent.  She reports no significant nausea, vomiting and reports that constipation she had last week has improved as well.  She denies diarrhea but does report some dysuria which is recurrent.  She denies any fevers or chills.  Denies any congestion, cough, or other changes at this time.  Patient was told by her neurology team to come get evaluated to make sure she does not have a new concerning arrhythmia or any significant lab abnormality requiring intervention at this time.  On exam, lungs are clear and chest is nontender.  Abdomen is nontender.  No focal neurologic deficits initially.  Normal finger-nose-finger testing, normal sensation and strength in extremities, symmetric smile, clear speech.  Pupils are symmetric and reactive normal extraocular movements.  EKG showed a sinus rhythm with no arrhythmia.  Specifically, no A. fib or a flutter.  Clinically I suspect a combination of starting the prednisone daily and stopping the propranolol in combination caused her to have recurrent of her chronic palpitations.  However, we do need to make sure she does not have significant electrolyte abnormalities, persistent arrhythmias, or any new concerning abnormality.  With her urinary symptoms, will make sure there is not a UTI or pneumonia or other abnormality.  If work-up is reassuring, anticipate discussion together to discharge and have her continue outpatient management with her primary teams.  9:59 PM Urinalysis show concern for possible UTI.  We will treat with antibiotics for UTI.  Spoke with pharmacy who recommended Keflex in the context of her myasthenia gravis.  Other work-up showed stable slight elevation in troponin, given lack of further chest pain, do not suspect ACS or other acute cardiac abnormality at this time.  Other work-up overall reassuring as we discussed with patient.  I spoke with  neurology who recommended her starting a low-dose atenolol and gradually increasing as it is more selective than the propranolol and less likely to cause a myasthenia crisis.  We discussed this with patient and they would like to start this medication.  She will follow-up with her cardiology team, neurology team, and PCP for further management.  She had no other questions or concerns and was discharged in good condition.   Final Clinical Impression(s) / ED Diagnoses Final diagnoses:  Heart palpitations  Other chest pain  Acute cystitis without hematuria    Rx / DC Orders ED Discharge Orders          Ordered    atenolol (TENORMIN) 25 MG tablet  Daily        06/27/21 2158    cephALEXin (KEFLEX) 500 MG capsule  2 times daily        06/27/21 2158            Clinical Impression: 1. Heart palpitations   2. Other chest pain   3. Acute cystitis without hematuria     Disposition: Discharge  Condition: Good  I have discussed the results, Dx and Tx plan with the pt(& family if present). He/she/they expressed understanding and agree(s) with the plan. Discharge instructions discussed at great length. Strict return precautions discussed and pt &/or family have verbalized understanding of the instructions. No further questions at time of discharge.    New Prescriptions   ATENOLOL (TENORMIN) 25 MG TABLET    Take 1 tablet (25 mg total) by mouth daily.   CEPHALEXIN (KEFLEX) 500 MG CAPSULE    Take 1 capsule (500 mg total) by mouth 2 (two) times daily for 7 days.  Follow Up: your neurology team, cardiology team, and PCP        Nidia Grogan, Canary Brimhristopher J, MD 06/27/21 2206

## 2021-06-30 ENCOUNTER — Telehealth: Payer: Self-pay | Admitting: Cardiovascular Disease

## 2021-06-30 LAB — URINE CULTURE: Culture: 30000 — AB

## 2021-06-30 NOTE — Telephone Encounter (Signed)
New Message:   Patient says she have been just diagnosed with Myasthenia Gravis. She said her doctor says she can no longer take Propranolol. She wants to know what can she take in the place of the Propranolol? Her doctor said she can not take any Beta Blocker with this disease. her

## 2021-06-30 NOTE — Telephone Encounter (Signed)
Returned call to pt. She states that she has Myasthenia Gravis and Nuro said it is worse now. She can no longer can take atenenol or propranolol. She would like a new medication instead of these, neuro would not do it. She states that neuro will refer to [facility] for infusion. I have scheduled 1st available appt. She will continue to go to the ER if needed. She will call as needed.

## 2021-07-01 ENCOUNTER — Telehealth: Payer: Self-pay

## 2021-07-01 NOTE — Telephone Encounter (Signed)
Post ED Visit - Positive Culture Follow-up  Culture report reviewed by antimicrobial stewardship pharmacist: [facility] []  , Pharm.D. []  Enzo Bi, .D., BCPS AQ-ID []  Celedonio Miyamoto, Pharm.D., BCPS []  [address], .D., BCPS []  [place], .D., BCPS, AAHIVP []  Georgina Pillion, Pharm.D., BCPS, AAHIVP []  [address], PharmD, BCPS []  , PharmD, BCPS []  [place], PharmD, BCPS []  [place], PharmD []  , PharmD, BCPS []  Estella Husk, PharmD  Pharmacy Team [x]  Lysle Pearl, PharmD []  , PharmD []  Phillips Climes, PharmD []  , Rph []  Agapito Games) , PharmD []  Verlan Friends, PharmD []  , PharmD []  Mervyn Gay, PharmD []  , PharmD []  Vinnie Level, PharmD []  [facility], PharmD []  , PharmD []  Lucky Cowboy, PharmD   Positive urine culture Treated with Cephalexin, organism sensitive to the same and no further patient follow-up is required at this time.  07/01/2021, 12:03 PM

## 2021-07-15 ENCOUNTER — Other Ambulatory Visit: Payer: Self-pay

## 2021-07-15 ENCOUNTER — Emergency Department (HOSPITAL_BASED_OUTPATIENT_CLINIC_OR_DEPARTMENT_OTHER): Payer: Federal, State, Local not specified - PPO

## 2021-07-15 ENCOUNTER — Encounter (HOSPITAL_BASED_OUTPATIENT_CLINIC_OR_DEPARTMENT_OTHER): Payer: Self-pay | Admitting: *Deleted

## 2021-07-15 ENCOUNTER — Emergency Department (HOSPITAL_BASED_OUTPATIENT_CLINIC_OR_DEPARTMENT_OTHER)
Admission: EM | Admit: 2021-07-15 | Discharge: 2021-07-15 | Disposition: A | Discharge status: Home / Self Care | Payer: Federal, State, Local not specified - PPO | Attending: Emergency Medicine | Admitting: Emergency Medicine

## 2021-07-15 ENCOUNTER — Telehealth: Payer: Self-pay | Admitting: Cardiovascular Disease

## 2021-07-15 DIAGNOSIS — E039 Hypothyroidism, unspecified: Secondary | ICD-10-CM | POA: Insufficient documentation

## 2021-07-15 DIAGNOSIS — G7000 Myasthenia gravis without (acute) exacerbation: Secondary | ICD-10-CM | POA: Diagnosis not present

## 2021-07-15 DIAGNOSIS — R0602 Shortness of breath: Secondary | ICD-10-CM | POA: Insufficient documentation

## 2021-07-15 DIAGNOSIS — R1310 Dysphagia, unspecified: Secondary | ICD-10-CM | POA: Diagnosis not present

## 2021-07-15 DIAGNOSIS — Z87891 Personal history of nicotine dependence: Secondary | ICD-10-CM | POA: Diagnosis not present

## 2021-07-15 DIAGNOSIS — Z79899 Other long term (current) drug therapy: Secondary | ICD-10-CM | POA: Diagnosis not present

## 2021-07-15 LAB — CBC WITH DIFFERENTIAL/PLATELET
Abs Immature Granulocytes: 0.03 10*3/uL (ref 0.00–0.07)
Basophils Absolute: 0 10*3/uL (ref 0.0–0.1)
Basophils Relative: 0 %
Eosinophils Absolute: 0 10*3/uL (ref 0.0–0.5)
Eosinophils Relative: 0 %
HCT: 42.3 % (ref 36.0–46.0)
Hemoglobin: 14 g/dL (ref 12.0–15.0)
Immature Granulocytes: 0 %
Lymphocytes Relative: 7 %
Lymphs Abs: 0.7 10*3/uL (ref 0.7–4.0)
MCH: 30.8 pg (ref 26.0–34.0)
MCHC: 33.1 g/dL (ref 30.0–36.0)
MCV: 93.2 fL (ref 80.0–100.0)
Monocytes Absolute: 0.2 10*3/uL (ref 0.1–1.0)
Monocytes Relative: 2 %
Neutro Abs: 9.1 10*3/uL — ABNORMAL HIGH (ref 1.7–7.7)
Neutrophils Relative %: 91 %
Platelets: 242 10*3/uL (ref 150–400)
RBC: 4.54 MIL/uL (ref 3.87–5.11)
RDW: 13.1 % (ref 11.5–15.5)
WBC: 10 10*3/uL (ref 4.0–10.5)
nRBC: 0 % (ref 0.0–0.2)

## 2021-07-15 LAB — URINALYSIS, ROUTINE W REFLEX MICROSCOPIC
Bilirubin Urine: NEGATIVE
Glucose, UA: NEGATIVE mg/dL
Hgb urine dipstick: NEGATIVE
Ketones, ur: NEGATIVE mg/dL
Leukocytes,Ua: NEGATIVE
Nitrite: NEGATIVE
Protein, ur: NEGATIVE mg/dL
Specific Gravity, Urine: 1.012 (ref 1.005–1.030)
pH: 6.5 (ref 5.0–8.0)

## 2021-07-15 LAB — COMPREHENSIVE METABOLIC PANEL WITH GFR
ALT: 16 U/L (ref 0–44)
AST: 16 U/L (ref 15–41)
Albumin: 4.1 g/dL (ref 3.5–5.0)
Alkaline Phosphatase: 55 U/L (ref 38–126)
Anion gap: 10 (ref 5–15)
BUN: 18 mg/dL (ref 8–23)
CO2: 27 mmol/L (ref 22–32)
Calcium: 9.4 mg/dL (ref 8.9–10.3)
Chloride: 97 mmol/L — ABNORMAL LOW (ref 98–111)
Creatinine, Ser: 0.9 mg/dL (ref 0.44–1.00)
GFR, Estimated: >60 mL/min
Glucose, Bld: 148 mg/dL — ABNORMAL HIGH (ref 70–99)
Potassium: 3.6 mmol/L (ref 3.5–5.1)
Sodium: 134 mmol/L — ABNORMAL LOW (ref 135–145)
Total Bilirubin: 0.5 mg/dL (ref 0.3–1.2)
Total Protein: 7.4 g/dL (ref 6.5–8.1)

## 2021-07-15 LAB — TROPONIN I (HIGH SENSITIVITY)
Troponin I (High Sensitivity): 49 ng/L — ABNORMAL HIGH
Troponin I (High Sensitivity): 47 ng/L — ABNORMAL HIGH

## 2021-07-15 LAB — MAGNESIUM: Magnesium: 2.1 mg/dL (ref 1.7–2.4)

## 2021-07-15 MED ORDER — IOHEXOL 350 MG/ML SOLN
75.0000 mL | Freq: Once | INTRAVENOUS | Status: AC | PRN
  Administered 2021-07-15: 75 mL via INTRAVENOUS

## 2021-07-15 NOTE — ED Notes (Signed)
Jean Jennings pgd [phone]

## 2021-07-15 NOTE — ED Provider Notes (Signed)
Patient is an 80 year old female presenting today complaining of generalized weakness, shakiness, palpitations and feeling short of breath.  Patient recently treated for myasthenia gravis.  Did receive IVIG in the hospital earlier in July and started rituximab on Wednesday.  Symptoms have been present since yesterday.  She is still taking prednisone.  She denies any difficulty with speech and swallowing.  Patient's NIF is -41, labs are reassuring except for a troponin that is mildly more elevated at 47 compared to her prior in the 20s.  She is denying any chest pain, mild tachypnea but oxygen saturations in the high 90s.  She is able to speak in full sentences and well-appearing on exam.  Spoke with Dr. Ulysees Barns the patient's neurologist at [facility] and at this time she did not feel that patient was having a myasthenia flare as she has good neck flexion, normal leg strength and has been able to ambulate.  She did report that patient is at a hypercoagulable state with getting IVIG and felt that patient may need CTA of the chest to rule out clot or echo if she continues to have symptoms.  7:37 PM CTA is negative for PE or other acute pathology.  Repeat troponin troponin is unchanged at 47.  Findings were discussed with the patient.  Patient wishes to go home.  She reports that she is starting to feel better.  Sats remain greater than 97% on room air.  Her specialist felt that going home was reasonable if CT was negative.  Discussed with patient if her symptoms worsen she may need to return to the hospital so that she can receive an echo but at this time she does not have significant evidence of fluid overload.  We will have patient continue her current medications.  She has follow-up with her neurologist next week.   Gwyneth Sprout, MD 07/15/21 [phone]

## 2021-07-15 NOTE — Discharge Instructions (Addendum)
There was no sign of blood clot today, all your labs look normal except for your heart markers being mildly elevated compared to the prior ones but they stayed stable with the 2 you had today.  You do not seem to show significant muscle weakness or flare in your myasthenia gravis at this time.  Continue taking your prednisone.  If your symptoms worsen over the weekend and you feel like it is getting worse please return.

## 2021-07-15 NOTE — Telephone Encounter (Signed)
Spoke to triage RN from [employer] office at [facility] in [place].She stated patient received a IV infusion for myasthenia Gravis this past Wed 8/3.Stated patient did not sleep well last night due to pvc's,sob.Stated patient is not taking a beta blocker due to it is not recommended with having myasthenia gravis.Stated the IV infusion can cause afib.Stated Dr.Vanessa Balta would like to speak to [facility] about another plan of treatment for patient's frequent pvc's.Dr.Balta's cell # [phone].Advised Dr.Berry is out of office today.I will send message to him.  Spoke to patient she stated she could not sleep last night she was sob and shaky.She is still sob and shaky this morning.Appointment scheduled with Edd Fabian 8/16 at 2:15 pm.Advised she needs to go to ED to be evaluated.

## 2021-07-15 NOTE — Progress Notes (Signed)
NIF  -41

## 2021-07-15 NOTE — ED Provider Notes (Signed)
MEDCENTER [facility] EMERGENCY DEPT Provider Note   CSN: [id number] Arrival date & time: 07/15/21  1126     History Chief Complaint  Patient presents with   Shortness of Breath    Jean Jennings is a 80 y.o. female.  HPI  80 year old female with recent diagnosis of myasthenia gravis along with HLD and hypothyroidism presents the emergency department with concern for PVCs and general unwell feeling.  About a month ago patient was admitted with severe symptoms secondary to MG including shortness of breath, difficulty swallowing.  At that time she received IVIG as an inpatient.  She was discharged and planned for outpatient infusions.  2 days ago on Wednesday she got an infusion of rituximab which was her first treatment of this.  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   Hot flashes 08/19/2018   Hypercholesterolemia 08/19/2018   Hypothyroid 08/19/2018   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   Palpitations 08/19/2018   Tremor 08/19/2018   Vertigo 08/19/2018   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    Past Surgical History:  Procedure Laterality Date   ABDOMINAL HYSTERECTOMY  1970's   arthroscopic surgery  oct 2012   left knee   COLONOSCOPY WITH PROPOFOL N/A 06/03/2013   Procedure: COLONOSCOPY WITH PROPOFOL;  Surgeon: Charolett Bumpers, MD;  Location: [facility];  Service: Endoscopy;  Laterality: N/A;   left wriist  ligament surgery  sev yrs ago   LUMBAR LAMINECTOMY/DECOMPRESSION MICRODISCECTOMY Left 06/09/2015   Procedure: Left Lumbar two-three Microdiskectomy;  Surgeon: Tressie Stalker, MD;  Location: [facility];  Service: Neurosurgery;  Laterality: Left;  Left L2-3 Microdiskectomy   thryoid surgery  1963   partial   TONSILLECTOMY       OB History   No obstetric history on file.     Family History  Problem Relation Age of Onset   Liver cancer Mother    Colon cancer Mother    Bone cancer Father     Social History   Tobacco Use   Smoking status: Former    Packs/day: 0.25    Years: 3.00    Pack years: 0.75    Types: Cigarettes    Quit date: 12/11/1968    Years since quitting: 52.6   Smokeless tobacco: Never  Vaping Use   Vaping Use: Never used  Substance Use Topics   Alcohol use: Yes    Comment: occasional   Drug use: No    Home Medications Prior to Admission medications   Medication Sig Start Date End Date Taking? Authorizing Provider  ALPRAZolam Prudy Feeler) 0.25 MG tablet For MRI- Take 1-2 30-45 minutes before MRI, additional tablet can be taken at the time of the scan 04/28/21   Huston Foley, MD  atenolol (TENORMIN) 25 MG tablet Take 1 tablet (25 mg total) by mouth  daily. 06/27/21   Tegeler, Canary Brim, MD  Cholecalciferol (VITAMIN D) 2000 UNITS CAPS Take 1 capsule by mouth every evening.     [provider]  Influenza vac split quadrivalent PF (FLUZONE HIGH-DOSE) 0.5 ML injection ADM 0.5ML IM UTD 09/17/15   [provider]  levothyroxine (SYNTHROID) 25 MCG tablet TK 1 T PO QD 04/28/19   [provider]  levothyroxine (SYNTHROID, LEVOTHROID) 112 MCG tablet Take 112 mcg by mouth daily before breakfast.    [provider]  Probiotic Product (PROBIOTIC DAILY PO) Take 1 capsule by mouth daily.    [provider]  propranolol (INDERAL) 40 MG tablet Take 1 tablet by mouth 2 (two) times daily. 07/30/17   [provider]  tretinoin  (RETIN-A) 0.1 % cream APPLY A PEA SIZED AMOUNT TO FACE NIGHTLY AT BEDTIME AS DIRECTED 12/26/18   [provider]    Allergies    Codeine  Review of Systems   Review of Systems  Constitutional:  Positive for fatigue. Negative for chills and fever.  HENT:  Negative for congestion.   Eyes:  Negative for visual disturbance.  Respiratory:  Positive for shortness of breath.   Cardiovascular:  Positive for palpitations. Negative for chest pain and leg swelling.  Gastrointestinal:  Negative for abdominal pain, diarrhea and vomiting.  Genitourinary:  Negative for dysuria.  Skin:  Negative for rash.  Neurological:  Negative for headaches.   Physical Exam Updated Vital Signs BP (!) 166/76 (BP Location: Right Arm)   Pulse 78   Temp 98 F (36.7 C) (Oral)   Resp 20   Ht 5\' 4"  (1.626 m)   Wt 61.2 kg   SpO2 98%   BMI 23.17 kg/m   Physical Exam Vitals and nursing note reviewed.  Constitutional:      General: She is not in acute distress.    Appearance: Normal appearance.  HENT:     Head: Normocephalic.     Mouth/Throat:     Mouth: Mucous membranes are moist.  Eyes:     Pupils: Pupils are equal, round, and reactive to light.  Cardiovascular:     Rate and Rhythm: Normal rate.  Pulmonary:     Effort: Pulmonary effort is normal. No tachypnea, accessory muscle usage or respiratory distress.  Abdominal:     Palpations: Abdomen is soft.     Tenderness: There is no abdominal tenderness.  Musculoskeletal:     Right lower leg: No edema.     Left lower leg: No edema.  Skin:    General: Skin is warm.  Neurological:     Mental Status: She is alert and oriented to person, place, and time. Mental status is at baseline.  Psychiatric:        Mood and Affect: Mood normal.    ED Results / Procedures / Treatments   Labs (all labs ordered are listed, but only abnormal results are displayed) Labs Reviewed  CBC WITH DIFFERENTIAL/PLATELET  COMPREHENSIVE METABOLIC PANEL  URINALYSIS,  ROUTINE W REFLEX MICROSCOPIC  MAGNESIUM  TROPONIN I (HIGH SENSITIVITY)    EKG EKG Interpretation  Date/Time:  Friday July 15 2021 11:45:19 EDT Ventricular Rate:  86 PR Interval:  116 QRS Duration: 76 QT Interval:  352 QTC Calculation: 421 R Axis:   42 Text Interpretation: Normal sinus rhythm Right atrial enlargement Cannot rule out Anterior infarct , age undetermined Abnormal ECG NSR, no STEMI Confirmed by [phone]) on 07/15/2021 2:19:30 PM  Radiology DG Chest 2 View  Result  Date: 07/15/2021 CLINICAL DATA:  Shortness of breath.  Myasthenia gravis. EXAM: CHEST - 2 VIEW COMPARISON:  06/27/2021 FINDINGS: Densely calcified right lower lobe pulmonary nodule is unchanged. Thoracic spondylosis. Cardiac and mediastinal margins appear normal. No substantial blunting of the costophrenic angles. IMPRESSION: 1. No acute findings to explain the patient's shortness of breath. 2. Stable densely calcified right lower lobe pulmonary nodule, benign. 3. Thoracic spondylosis. Electronically Signed   By: Gaylyn Rong M.D.   On: 07/15/2021 12:34    Procedures Procedures   Medications Ordered in ED Medications - No data to display  ED Course  I have reviewed the triage vital signs and the nursing notes.  Pertinent labs & imaging results that were available during my care of the patient were reviewed by me and considered in my medical decision making (see chart for details).    MDM Rules/Calculators/A&P                            80 year old female presents the emergency department with intermittent shortness of breath, PVCs and feeling jittery and off since getting her first dose of rituximab 2 days ago.  Patient had a recent admission for myasthenia gravis and symptoms secondary to this where she received IVIG.  She states that she was significantly improved and they discharged her home but she is feeling slightly worse after this outpatient infusion.  Vital signs are stable, no  respiratory distress, she is speaking in full sentences, no difficulty swallowing or controlling secretions.  Plan for metabolic work-up and reevaluation.   Final Clinical Impression(s) / ED Diagnoses Final diagnoses:  None    Rx / DC Orders ED Discharge Orders     None        Rozelle Logan, DO 07/15/21 1501

## 2021-07-15 NOTE — Telephone Encounter (Signed)
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 Please advise.Marland KitchenMarland Kitchen

## 2021-07-15 NOTE — ED Triage Notes (Signed)
Pt recently dx with Myasthenia Gravis. She was taken off cardiac meds due to this dx. Pt states she had PVC's and feels SHOB since last night.  Sent by Cardiology

## 2021-07-20 NOTE — Telephone Encounter (Signed)
Called patient left message on personal voice mail Dr.Berry happy to see you in office to evaluate you.I scheduled you a appointment with him this Friday 8/12 at 11:15 am.Advised to call back if this is not a good time.

## 2021-07-22 ENCOUNTER — Encounter: Payer: Self-pay | Admitting: Cardiovascular Disease

## 2021-07-22 ENCOUNTER — Ambulatory Visit (INDEPENDENT_AMBULATORY_CARE_PROVIDER_SITE_OTHER): Payer: Federal, State, Local not specified - PPO

## 2021-07-22 ENCOUNTER — Other Ambulatory Visit: Payer: Self-pay

## 2021-07-22 ENCOUNTER — Ambulatory Visit: Payer: Federal, State, Local not specified - PPO | Admitting: Cardiovascular Disease

## 2021-07-22 VITALS — BP 127/74 | HR 85 | Ht 64.0 in | Wt 136.6 lb

## 2021-07-22 DIAGNOSIS — R002 Palpitations: Secondary | ICD-10-CM

## 2021-07-22 DIAGNOSIS — I493 Ventricular premature depolarization: Secondary | ICD-10-CM

## 2021-07-22 NOTE — Assessment & Plan Note (Signed)
History of palpitations in the past by event monitoring which showed PACs and PVCs.  She was on propranolol but unfortunately was recently diagnosed with myasthenia gravis.  Propranolol was discontinued and her palpitations have become more symptomatic.  She was told that she could not be on calcium channel blockers or beta-blockers because of her myasthenia.  I am referring her to EP for further evaluation of pharmacologic options.

## 2021-07-22 NOTE — Progress Notes (Unsigned)
Enrolled patient for a 14 day Zio XT  monitor to be mailed to patients home  °

## 2021-07-22 NOTE — Progress Notes (Signed)
07/22/2021 Jean Jennings   [birth year]  [id number]  Primary Physician Alysia Penna, MD Primary Cardiologist: Runell Gess MD Nicholes Calamity, [place]  HPI:  Jean Jennings is a 80 y.o.  thin and fit appearing married Caucasian female mother of 3, grandmother 7 grandchildren who is retired from working in [employer].  She was referred by Dr. Link Snuffer for evaluation of symptomatic palpitations.  I last saw her in the office 03/02/2020.  She has a positive family history for heart disease.  She has had palpitations for decades worse in the last 2 to 3 months after having 2 steroid injections in her foot for symptomatic plantar fasciitis by Dr. Ardelle Anton.  She had a recent event monitor that showed sinus rhythm/sinus bradycardia with occasional PVCs and PACs and a normal 2D echo.   Since I saw her a year and a half ago she did develop myasthenia gravis in April of this year.  She was taken off of propranolol at that time and since then her palpitations have become more severe.  She was hospitalized at [facility] back in July.  2D echo was essentially normal as was a chest CT.  Current Meds  Medication Sig   Cholecalciferol (VITAMIN D) 2000 UNITS CAPS Take 1 capsule by mouth every evening.    Influenza vac split quadrivalent PF (FLUZONE HIGH-DOSE) 0.5 ML injection ADM 0.5ML IM UTD   levothyroxine (SYNTHROID) 25 MCG tablet TK 1 T PO QD   levothyroxine (SYNTHROID, LEVOTHROID) 112 MCG tablet Take 112 mcg by mouth daily before breakfast.   predniSONE (DELTASONE) 20 MG tablet Take 2 tablets by mouth daily.   Probiotic Product (PROBIOTIC DAILY PO) Take 1 capsule by mouth daily.     Allergies  Allergen Reactions   Codeine Nausea And Vomiting    Lightheaded also    Social History   Socioeconomic History   Marital status: Married    Spouse name: Not on file   Number of children: Not on file   Years of education: Not on file   Highest education level: Not on file   Occupational History   Not on file  Tobacco Use   Smoking status: Former    Packs/day: 0.25    Years: 3.00    Pack years: 0.75    Types: Cigarettes    Quit date: 12/11/1968    Years since quitting: 52.6   Smokeless tobacco: Never  Vaping Use   Vaping Use: Never used  Substance and Sexual Activity   Alcohol use: Yes    Comment: occasional   Drug use: No   Sexual activity: Not on file  Other Topics Concern   Not on file  Social History Narrative   Not on file   Social Determinants of Health   Financial Resource Strain: Not on file  Food Insecurity: Not on file  Transportation Needs: Not on file  Physical Activity: Not on file  Stress: Not on file  Social Connections: Not on file  Intimate Partner Violence: Not on file     Review of Systems: General: negative for chills, fever, night sweats or weight changes.  Cardiovascular: negative for chest pain, dyspnea on exertion, edema, orthopnea, palpitations, paroxysmal nocturnal dyspnea or shortness of breath Dermatological: negative for rash Respiratory: negative for cough or wheezing Urologic: negative for hematuria Abdominal: negative for nausea, vomiting, diarrhea, bright red blood per rectum, melena, or hematemesis Neurologic: negative for visual changes, syncope, or dizziness All other systems reviewed  and are otherwise negative except as noted above.    Blood pressure 127/74, pulse 85, height 5\' 4"  (1.626 m), weight 136 lb 9.6 oz (62 kg), SpO2 97 %.  General appearance: alert and no distress Neck: no adenopathy, no carotid bruit, no JVD, supple, symmetrical, trachea midline, and thyroid not enlarged, symmetric, no tenderness/mass/nodules Lungs: clear to auscultation bilaterally Heart: regular rate and rhythm, S1, S2 normal, no murmur, click, rub or gallop Extremities: extremities normal, atraumatic, no cyanosis or edema Pulses: 2+ and symmetric Skin: Skin color, texture, turgor normal. No rashes or  lesions Neurologic: Grossly normal  EKG sinus rhythm at 85 without ST or T wave changes.  I personally reviewed this EKG.  ASSESSMENT AND PLAN:   Hypercholesterolemia History of hyperlipidemia currently not on statin therapy with lipid profile performed 08/18/2020 revealing total cholesterol 216, LDL 136 HDL 55.  Palpitations History of palpitations in the past by event monitoring which showed PACs and PVCs.  She was on propranolol but unfortunately was recently diagnosed with myasthenia gravis.  Propranolol was discontinued and her palpitations have become more symptomatic.  She was told that she could not be on calcium channel blockers or beta-blockers because of her myasthenia.  I am referring her to EP for further evaluation of pharmacologic options.     10/18/2020 MD FACP,FACC,FAHA, [facility] 07/22/2021 12:05 PM

## 2021-07-22 NOTE — Assessment & Plan Note (Signed)
History of hyperlipidemia currently not on statin therapy with lipid profile performed 08/18/2020 revealing total cholesterol 216, LDL 136 HDL 55.

## 2021-07-22 NOTE — Patient Instructions (Addendum)
Medication Instructions:  Your physician recommends that you continue on your current medications as directed. Please refer to the Current Medication list given to you today.  *If you need a refill on your cardiac medications before your next appointment, please call your pharmacy*  Testing: ZIO XT- Long Term Monitor Instructions   Your physician has requested you wear a ZIO patch monitor for __14_ days.  This is a single patch monitor.   [employer] supplies one patch monitor per enrollment. Additional stickers are not available. Please do not apply patch if you will be having a Nuclear Stress Test, Echocardiogram, Cardiac CT, MRI, or Chest Xray during the period you would be wearing the monitor. The patch cannot be worn during these tests. You cannot remove and re-apply the ZIO XT patch monitor.  Your ZIO patch monitor will be sent Fed Ex from [employer] directly to your home address. It may take 3-5 days to receive your monitor after you have been enrolled.  Once you have received your monitor, please review the enclosed instructions. Your monitor has already been registered assigning a specific monitor serial # to you.  Billing and Patient Assistance Program Information   We have supplied [employer] with any of your insurance information on file for billing purposes. [employer] offers a sliding scale Patient Assistance Program for patients that do not have insurance, or whose insurance does not completely cover the cost of the ZIO monitor.   You must apply for the Patient Assistance Program to qualify for this discounted rate.     To apply, please call [employer] at [phone], select option 4, then select option 2, and ask to apply for Patient Assistance Program.  Meredeth Ide will ask your household income, and how many people are in your household.  They will quote your out-of-pocket cost based on that information.  [employer] will also be able to set up a 35-month, interest-free payment plan if  needed.  Applying the monitor   Shave hair from upper left chest.  Hold abrader disc by orange tab. Rub abrader in 40 strokes over the upper left chest as indicated in your monitor instructions.  Clean area with 4 enclosed alcohol pads. Let dry.  Apply patch as indicated in monitor instructions. Patch will be placed under collarbone on left side of chest with arrow pointing upward.  Rub patch adhesive wings for 2 minutes. Remove white label marked "1". Remove the white label marked "2". Rub patch adhesive wings for 2 additional minutes.  While looking in a mirror, press and release button in center of patch. A small green light will flash 3-4 times. This will be your only indicator that the monitor has been turned on. ?  Do not shower for the first 24 hours. You may shower after the first 24 hours.  Press the button if you feel a symptom. You will hear a small click. Record Date, Time and Symptom in the Patient Logbook.  When you are ready to remove the patch, follow instructions on the last 2 pages of the Patient Logbook. Stick patch monitor onto the last page of Patient Logbook.  Place Patient Logbook in the blue and white box.  Use locking tab on box and tape box closed securely.  The blue and white box has prepaid postage on it. Please place it in the mailbox as soon as possible. Your physician should have your test results approximately 7 days after the monitor has been mailed back to [facility].  Call [facility]  at [phone] if you have questions regarding your ZIO XT patch monitor. Call them immediately if you see an orange light blinking on your monitor.  If your monitor falls off in less than 4 days, contact our Monitor department at [phone]. ?If your monitor becomes loose or falls off after 4 days call [employer] at [phone] for suggestions on securing your monitor.?   Follow-Up: At [facility], you and your health needs are our priority.  As part of  our continuing mission to provide you with exceptional heart care, we have created designated Provider Care Teams.  These Care Teams include your primary Cardiologist (physician) and Advanced Practice Providers (APPs -  Physician Assistants and Nurse Practitioners) who all work together to provide you with the care you need, when you need it.  We recommend signing up for the patient portal called "MyChart".  Sign up information is provided on this After Visit Summary.  MyChart is used to connect with patients for Virtual Visits (Telemedicine).  Patients are able to view lab/test results, encounter notes, upcoming appointments, etc.  Non-urgent messages can be sent to your provider as well.   To learn more about what you can do with MyChart, go to [email or web address].    Your next appointment:   6 month(s)  The format for your next appointment:   In Person  Provider:   Nanetta Batty, MD   Other Instructions You have been referred to: Electrophysiology

## 2021-07-26 ENCOUNTER — Ambulatory Visit (HOSPITAL_BASED_OUTPATIENT_CLINIC_OR_DEPARTMENT_OTHER): Payer: Federal, State, Local not specified - PPO | Admitting: General Practice

## 2021-07-28 DIAGNOSIS — I493 Ventricular premature depolarization: Secondary | ICD-10-CM

## 2021-07-28 DIAGNOSIS — R002 Palpitations: Secondary | ICD-10-CM | POA: Diagnosis not present

## 2021-08-25 ENCOUNTER — Ambulatory Visit: Payer: Federal, State, Local not specified - PPO | Admitting: Cardiology

## 2021-08-25 ENCOUNTER — Other Ambulatory Visit: Payer: Self-pay

## 2021-08-25 ENCOUNTER — Encounter: Payer: Self-pay | Admitting: Cardiology

## 2021-08-25 ENCOUNTER — Ambulatory Visit: Payer: Federal, State, Local not specified - PPO | Admitting: Physician Assistant

## 2021-08-25 VITALS — BP 134/72 | HR 73 | Ht 64.0 in | Wt 138.0 lb

## 2021-08-25 DIAGNOSIS — I491 Atrial premature depolarization: Secondary | ICD-10-CM

## 2021-08-25 DIAGNOSIS — R002 Palpitations: Secondary | ICD-10-CM

## 2021-08-25 NOTE — Patient Instructions (Signed)
Medication Instructions:  Your physician recommends that you continue on your current medications as directed. Please refer to the Current Medication list given to you today.  *If you need a refill on your cardiac medications before your next appointment, please call your pharmacy*   Lab Work: None ordered.  If you have labs (blood work) drawn today and your tests are completely normal, you will receive your results only by: MyChart Message (if you have MyChart) OR A paper copy in the mail If you have any lab test that is abnormal or we need to change your treatment, we will call you to review the results.   Testing/Procedures: None ordered.    Follow-Up: At [facility], you and your health needs are our priority.  As part of our continuing mission to provide you with exceptional heart care, we have created designated Provider Care Teams.  These Care Teams include your primary Cardiologist (physician) and Advanced Practice Providers (APPs -  Physician Assistants and Nurse Practitioners) who all work together to provide you with the care you need, when you need it.  We recommend signing up for the patient portal called "MyChart".  Sign up information is provided on this After Visit Summary.  MyChart is used to connect with patients for Virtual Visits (Telemedicine).  Patients are able to view lab/test results, encounter notes, upcoming appointments, etc.  Non-urgent messages can be sent to your provider as well.   To learn more about what you can do with MyChart, go to [email or web address].    Your next appointment:   9 month(s)  The format for your next appointment:   In Person  Provider:   Otilio Saber, PA-C or Maryella Shivers

## 2021-08-25 NOTE — Progress Notes (Signed)
Electrophysiology Office Note:    Date:  08/25/2021   ID:  Jean Jennings, DOB 1941-11-06, MRN [id number]  PCP:  Alysia Penna, MD  [facility] Cardiologist:  None  [facility] Electrophysiologist:  Lanier Prude, MD   Referring MD: Alysia Penna, MD   Chief Complaint: PVCs  History of Present Illness:    [name] is a 80 y.o. female who presents for an evaluation of PVCs at the request of Dr. Allyson Sabal. Their medical history includes myasthenia gravis, hyperlipidemia.  The patient last saw Dr. Gery Pray on July 22, 2021.  She told Dr. Gery Pray that she had had palpitations for decades but these worsened in the last few months after having steroid injections in her foot for symptomatic plantar fasciitis.  She did wear a recent Holter monitor which showed rare PVCs.  There were PACs totaling 2.2% of her total beats and 11 episodes of SVT with the longest episode lasting 16 beats at an average rate of 120 bpm.  SVT was detected in close proximity to symptomatic patient events.  Today she tells me that her palpitations have improved significantly recently.   Past Medical History:  Diagnosis Date   Anemia    as a younger woman   Anxiety    Arthritis    Diarrhea last 3 to 4 months   stomach pain   Dysrhythmia 3-4 yrs ago   hx irregular heart beat, dr Earl Gala manages   GERD (gastroesophageal reflux disease)    Hypothyroidism    IBS (irritable bowel syndrome)    Myasthenia gravis (HCC)    PONV (postoperative nausea and vomiting)    " a little bit"    Past Surgical History:  Procedure Laterality Date   ABDOMINAL HYSTERECTOMY  1970's   arthroscopic surgery  oct 2012   left knee   COLONOSCOPY WITH PROPOFOL N/A 06/03/2013   Procedure: COLONOSCOPY WITH PROPOFOL;  Surgeon: Charolett Bumpers, MD;  Location: [facility];  Service: Endoscopy;  Laterality: N/A;   left wriist ligament surgery  sev yrs ago   LUMBAR LAMINECTOMY/DECOMPRESSION MICRODISCECTOMY Left 06/09/2015   Procedure:  Left Lumbar two-three Microdiskectomy;  Surgeon: Tressie Stalker, MD;  Location: [facility];  Service: Neurosurgery;  Laterality: Left;  Left L2-3 Microdiskectomy   thryoid surgery  1963   partial   TONSILLECTOMY      Current Medications: Current Meds  Medication Sig   calcium acetate (PHOSLO) 667 MG tablet Take 667 mg by mouth 3 (three) times daily.   Cholecalciferol (VITAMIN D) 2000 UNITS CAPS Take 1 capsule by mouth every evening.    citalopram (CELEXA) 20 MG tablet Take 1 tablet by mouth daily.   Influenza vac split quadrivalent PF (FLUZONE HIGH-DOSE) 0.5 ML injection ADM 0.5ML IM UTD   levothyroxine (SYNTHROID) 25 MCG tablet TK 1 T PO QD   levothyroxine (SYNTHROID, LEVOTHROID) 112 MCG tablet Take 112 mcg by mouth daily before breakfast.   predniSONE (DELTASONE) 20 MG tablet Take 2 tablets by mouth daily.   Probiotic Product (PROBIOTIC DAILY PO) Take 1 capsule by mouth daily.   tretinoin (RETIN-A) 0.1 % cream      Allergies:   Codeine   Social History   Socioeconomic History   Marital status: Married    Spouse name: Not on file   Number of children: Not on file   Years of education: Not on file   Highest education level: Not on file  Occupational History   Not on file  Tobacco Use  Smoking status: Former    Packs/day: 0.25    Years: 3.00    Pack years: 0.75    Types: Cigarettes    Quit date: 12/11/1968    Years since quitting: 52.7   Smokeless tobacco: Never  Vaping Use   Vaping Use: Never used  Substance and Sexual Activity   Alcohol use: Yes    Comment: occasional   Drug use: No   Sexual activity: Not on file  Other Topics Concern   Not on file  Social History Narrative   Not on file   Social Determinants of Health   Financial Resource Strain: Not on file  Food Insecurity: Not on file  Transportation Needs: Not on file  Physical Activity: Not on file  Stress: Not on file  Social Connections: Not on file     Family History: The patient's family  history includes Bone cancer in her father; Colon cancer in her mother; Liver cancer in her mother.  ROS:   Please see the history of present illness.    All other systems reviewed and are negative.  EKGs/Labs/Other Studies Reviewed:    The following studies were reviewed today:  August 19, 2021 ZIO personally reviewed 11 episodes of SVT, longest lasting 16 beats at an average rate 120 bpm SVT episodes detected in close proximity to patient triggered events Occasional SVE, 2.2% PVCs were rare, less than 1% Review of rhythm strip suggests PACs and salvos of AT  July 18, 2019 echo personally reviewed Left ventricular function normal, 60% Right ventricular function normal Mild AI   EKG:  The ekg ordered today demonstrates sinus rhythm. No PAC/PVC.  Recent Labs: 06/27/2021: TSH 0.271 07/15/2021: ALT 16; BUN 18; Creatinine, Ser 0.90; Hemoglobin 14.0; Magnesium 2.1; Platelets 242; Potassium 3.6; Sodium 134  Recent Lipid Panel No results found for: CHOL, TRIG, HDL, CHOLHDL, VLDL, LDLCALC, LDLDIRECT  Physical Exam:    VS:  BP 134/72   Pulse 73   Ht 5\' 4"  (1.626 m)   Wt 138 lb (62.6 kg)   BMI 23.69 kg/m     Wt Readings from Last 3 Encounters:  08/25/21 138 lb (62.6 kg)  07/22/21 136 lb 9.6 oz (62 kg)  07/15/21 135 lb (61.2 kg)     GEN: Well nourished, well developed in no acute distress. Appears younger than stated age. HEENT: Normal NECK: No JVD; No carotid bruits LYMPHATICS: No lymphadenopathy CARDIAC: RRR, no murmurs, rubs, gallops RESPIRATORY:  Clear to auscultation without rales, wheezing or rhonchi  ABDOMEN: Soft, non-tender, non-distended MUSCULOSKELETAL:  No edema; No deformity  SKIN: Warm and dry NEUROLOGIC:  Alert and oriented x 3 PSYCHIATRIC:  Normal affect   ASSESSMENT:    1. Palpitations   2. PAC (premature atrial contraction)    PLAN:    In order of problems listed above:  #Palpitations/PAC Her palpitations correspond to [facility] on the monitor. She  is not able to take CCB/BB due to her myasthenia diagnosis. I do not think her low burden of PAC warrants an antiarrhythmic drug especially with the symptoms improving recently. I suspect they were worse in the setting of her IVIG infusion.  For now, continue current medical therapy. If her symptoms were to significantly worsen in the future, would need to discuss safety of antiarrhythmic therapy (propafenone, flecainide) with her neurology team.   #Myasthenia Gravis Limits safe use of BB/CCB.    Total time spent with patient today 45 minutes. This includes reviewing records, evaluating the patient and coordinating care.  Medication  Adjustments/Labs and Tests Ordered: Current medicines are reviewed at length with the patient today.  Concerns regarding medicines are outlined above.  Orders Placed This Encounter  Procedures   EKG 12-Lead    No orders of the defined types were placed in this encounter.    Signed, Rossie Muskrat. Lalla Brothers, MD, [facility], [facility] 08/25/2021 10:04 PM    Electrophysiology [facility]

## 2021-08-31 ENCOUNTER — Institutional Professional Consult (permissible substitution): Payer: Federal, State, Local not specified - PPO | Admitting: Internal Medicine

## 2021-10-27 ENCOUNTER — Other Ambulatory Visit: Payer: Self-pay | Admitting: Internal Medicine

## 2021-10-27 DIAGNOSIS — Z1231 Encounter for screening mammogram for malignant neoplasm of breast: Secondary | ICD-10-CM

## 2021-12-20 ENCOUNTER — Other Ambulatory Visit: Payer: Self-pay | Admitting: Internal Medicine

## 2021-12-20 ENCOUNTER — Ambulatory Visit
Admission: RE | Admit: 2021-12-20 | Discharge: 2021-12-20 | Disposition: A | Discharge status: Home / Self Care | Payer: Federal, State, Local not specified - PPO | Source: Ambulatory Visit | Attending: Internal Medicine | Admitting: Internal Medicine

## 2021-12-20 DIAGNOSIS — Z1231 Encounter for screening mammogram for malignant neoplasm of breast: Secondary | ICD-10-CM

## 2022-01-10 ENCOUNTER — Telehealth: Payer: Self-pay

## 2022-01-10 NOTE — Telephone Encounter (Signed)
Spoke with pt regarding change in appointment time. Pt would like to keep her appointment time to see Dr. Allyson Sabal. No changes were made.

## 2022-01-11 ENCOUNTER — Encounter: Payer: Self-pay | Admitting: Cardiovascular Disease

## 2022-01-11 ENCOUNTER — Other Ambulatory Visit: Payer: Self-pay

## 2022-01-11 ENCOUNTER — Ambulatory Visit: Payer: Federal, State, Local not specified - PPO | Admitting: Cardiovascular Disease

## 2022-01-11 VITALS — BP 146/72 | HR 69 | Ht 64.0 in | Wt 156.2 lb

## 2022-01-11 DIAGNOSIS — I491 Atrial premature depolarization: Secondary | ICD-10-CM

## 2022-01-11 DIAGNOSIS — R002 Palpitations: Secondary | ICD-10-CM | POA: Diagnosis not present

## 2022-01-11 DIAGNOSIS — E7800 Pure hypercholesterolemia, unspecified: Secondary | ICD-10-CM

## 2022-01-11 NOTE — Assessment & Plan Note (Signed)
History of hyperlipidemia not on statin therapy with lipid profile performed 08/29/2021 revealing 10 cholesterol 249, LDL 147 and HDL of 83.  I am Going to get a coronary calcium score to help determine aggressiveness of pharmacologic therapy.

## 2022-01-11 NOTE — Patient Instructions (Signed)
Medication Instructions:  Your physician recommends that you continue on your current medications as directed. Please refer to the Current Medication list given to you today.  *If you need a refill on your cardiac medications before your next appointment, please call your pharmacy*   Testing/Procedures: Dr. Gwenlyn Found has ordered a CT coronary calcium score.   [facility] ([address], [place] [address])   This is $99 out of pocket.   Coronary CalciumScan A coronary calcium scan is an imaging test used to look for deposits of calcium and other fatty materials (plaques) in the inner lining of the blood vessels of the heart (coronary arteries). These deposits of calcium and plaques can partly clog and narrow the coronary arteries without producing any symptoms or warning signs. This puts a person at risk for a heart attack. This test can detect these deposits before symptoms develop. Tell a health care provider about: Any allergies you have. All medicines you are taking, including vitamins, herbs, eye drops, creams, and over-the-counter medicines. Any problems you or family members have had with anesthetic medicines. Any blood disorders you have. Any surgeries you have had. Any medical conditions you have. Whether you are pregnant or may be pregnant. What are the risks? Generally, this is a safe procedure. However, problems may occur, including: Harm to a pregnant woman and her unborn baby. This test involves the use of radiation. Radiation exposure can be dangerous to a pregnant woman and her unborn baby. If you are pregnant, you generally should not have this procedure done. Slight increase in the risk of cancer. This is because of the radiation involved in the test. What happens before the procedure? No preparation is needed for this procedure. What happens during the procedure? You will undress and remove any jewelry around your neck or chest. You  will put on a hospital gown. Sticky electrodes will be placed on your chest. The electrodes will be connected to an electrocardiogram (ECG) machine to record a tracing of the electrical activity of your heart. A CT scanner will take pictures of your heart. During this time, you will be asked to lie still and hold your breath for 2-3 seconds while a picture of your heart is being taken. The procedure may vary among health care providers and hospitals. What happens after the procedure? You can get dressed. You can return to your normal activities. It is up to you to get the results of your test. Ask your health care provider, or the department that is doing the test, when your results will be ready. Summary A coronary calcium scan is an imaging test used to look for deposits of calcium and other fatty materials (plaques) in the inner lining of the blood vessels of the heart (coronary arteries). Generally, this is a safe procedure. Tell your health care provider if you are pregnant or may be pregnant. No preparation is needed for this procedure. A CT scanner will take pictures of your heart. You can return to your normal activities after the scan is done. This information is not intended to replace advice given to you by your health care provider. Make sure you discuss any questions you have with your health care provider. Document Released: 05/25/2008 Document Revised: 10/16/2016 Document Reviewed: 10/16/2016 Elsevier Interactive Patient Education  2017 [place]: At [facility], you and your health needs are our priority.  As part of our continuing mission to provide you with exceptional heart  care, we have created designated Provider Care Teams.  These Care Teams include your primary Cardiologist (physician) and Advanced Practice Providers (APPs -  Physician Assistants and Nurse Practitioners) who all work together to provide you with the care you need, when you need  it.  We recommend signing up for the patient portal called "MyChart".  Sign up information is provided on this After Visit Summary.  MyChart is used to connect with patients for Virtual Visits (Telemedicine).  Patients are able to view lab/test results, encounter notes, upcoming appointments, etc.  Non-urgent messages can be sent to your provider as well.   To learn more about what you can do with MyChart, go to [email or web address].    Your next appointment:   6 month(s)  The format for your next appointment:   In Person  Provider:   Nanetta Batty, MD   Other Instructions  Heart-Healthy Eating Plan Heart-healthy meal planning includes: Eating less unhealthy fats. Eating more healthy fats. Making other changes in your diet. What are tips for following this plan? Cooking Avoid frying your food. Try to bake, boil, grill, or broil it instead. You can also reduce fat by: Removing the skin from poultry. Removing all visible fats from meats. Steaming vegetables in water or broth. Meal planning  At meals, divide your plate into four equal parts: Fill one-half of your plate with vegetables and green salads. Fill one-fourth of your plate with whole grains. Fill one-fourth of your plate with lean protein foods. Eat 4-5 servings of vegetables per day. A serving of vegetables is: 1 cup of raw or cooked vegetables. 2 cups of raw leafy greens. Eat 4-5 servings of fruit per day. A serving of fruit is: 1 medium whole fruit.  cup of dried fruit.  cup of fresh, frozen, or canned fruit.  cup of 100% fruit juice. Eat more foods that have soluble fiber. These are apples, broccoli, carrots, beans, peas, and barley. Try to get 20-30 g of fiber per day. Eat 4-5 servings of nuts, legumes, and seeds per week: 1 serving of dried beans or legumes equals  cup after being cooked. 1 serving of nuts is  cup. 1 serving of seeds equals 1 tablespoon. General information Eat more home-cooked  food. Eat less restaurant, buffet, and fast food. Limit or avoid alcohol. Limit foods that are high in starch and sugar. Avoid fried foods. Lose weight if you are overweight. Keep track of how much salt (sodium) you eat. This is important if you have high blood pressure. Ask your doctor to tell you more about this. Try to add vegetarian meals each week. Fats Choose healthy fats. These include olive oil and canola oil, flaxseeds, walnuts, almonds, and seeds. Eat more omega-3 fats. These include salmon, mackerel, sardines, tuna, flaxseed oil, and ground flaxseeds. Try to eat fish at least 2 times each week. Check food labels. Avoid foods with trans fats or high amounts of saturated fat. Limit saturated fats. These are often found in animal products, such as meats, butter, and cream. These are also found in plant foods, such as palm oil, palm kernel oil, and coconut oil. Avoid foods with partially hydrogenated oils in them. These have trans fats. Examples are stick margarine, some tub margarines, cookies, crackers, and other baked goods. What foods can I eat? Fruits All fresh, canned (in natural juice), or frozen fruits. Vegetables Fresh or frozen vegetables (raw, steamed, roasted, or grilled). Green salads. Grains Most grains. Choose whole wheat and whole grains most of the  time. Rice and pasta, including brown rice and pastas made with whole wheat. Meats and other proteins Lean, well-trimmed beef, veal, pork, and lamb. Chicken and [place] without skin. All fish and shellfish. Wild duck, rabbit, pheasant, and venison. Egg whites or low-cholesterol egg substitutes. Dried beans, peas, lentils, and tofu. Seeds and most nuts. Dairy Low-fat or nonfat cheeses, including ricotta and mozzarella. Skim or 1% milk that is liquid, powdered, or evaporated. Buttermilk that is made with low-fat milk. Nonfat or low-fat yogurt. Fats and oils Non-hydrogenated (trans-free) margarines. Vegetable oils, including  soybean, sesame, sunflower, olive, peanut, safflower, corn, canola, and cottonseed. Salad dressings or mayonnaise made with a vegetable oil. Beverages Mineral water. Coffee and tea. Diet carbonated beverages. Sweets and desserts Sherbet, gelatin, and fruit ice. Small amounts of dark chocolate. Limit all sweets and desserts. Seasonings and condiments All seasonings and condiments. The items listed above may not be a complete list of foods and drinks you can eat. Contact a dietitian for more options. What foods should I avoid? Fruits Canned fruit in heavy syrup. Fruit in cream or butter sauce. Fried fruit. Limit coconut. Vegetables Vegetables cooked in cheese, cream, or butter sauce. Fried vegetables. Grains Breads that are made with saturated or trans fats, oils, or whole milk. Croissants. Sweet rolls. Donuts. High-fat crackers, such as cheese crackers. Meats and other proteins Fatty meats, such as hot dogs, ribs, sausage, bacon, rib-eye roast or steak. High-fat deli meats, such as salami and bologna. Caviar. Domestic duck and goose. Organ meats, such as liver. Dairy Cream, sour cream, cream cheese, and creamed cottage cheese. Whole-milk cheeses. Whole or 2% milk that is liquid, evaporated, or condensed. Whole buttermilk. Cream sauce or high-fat cheese sauce. Yogurt that is made from whole milk. Fats and oils Meat fat, or shortening. Cocoa butter, hydrogenated oils, palm oil, coconut oil, palm kernel oil. Solid fats and shortenings, including bacon fat, salt pork, lard, and butter. Nondairy cream substitutes. Salad dressings with cheese or sour cream. Beverages Regular sodas and juice drinks with added sugar. Sweets and desserts Frosting. Pudding. Cookies. Cakes. Pies. Milk chocolate or white chocolate. Buttered syrups. Full-fat ice cream or ice cream drinks. The items listed above may not be a complete list of foods and drinks to avoid. Contact a dietitian for more  information. Summary Heart-healthy meal planning includes eating less unhealthy fats, eating more healthy fats, and making other changes in your diet. Eat a balanced diet. This includes fruits and vegetables, low-fat or nonfat dairy, lean protein, nuts and legumes, whole grains, and heart-healthy oils and fats. This information is not intended to replace advice given to you by your health care provider. Make sure you discuss any questions you have with your health care provider. Document Revised: 04/07/2021 Document Reviewed: 04/07/2021 Elsevier Patient Education  2022 [employer].

## 2022-01-11 NOTE — Progress Notes (Signed)
01/11/2022 Jean Jennings   10-17-1941  [id number]  Primary Physician Velna Hatchet, MD Primary Cardiologist: Lorretta Harp MD [name], [place]  HPI:  Jean Jennings is a 81 y.o.  thin and fit appearing married Caucasian female mother of 36, grandmother 7 grandchildren who is retired from working in [employer].  She was referred by Dr. Ardeth Perfect for evaluation of symptomatic palpitations.  I last saw her in the office 07/22/2021 .  She has a positive family history for heart disease.  She has had palpitations for decades worse in the last 2 to 3 months after having 2 steroid injections in her foot for symptomatic plantar fasciitis by Dr. Jacqualyn Posey.  She had a recent event monitor that showed sinus rhythm/sinus bradycardia with occasional PVCs and PACs and a normal 2D echo.    She has developed myasthenia gravis in April of this year.  She was taken off of propranolol at that time and since then her palpitations have become more severe.  She was hospitalized at [facility] back in July.  2D echo was essentially normal as was a chest CT.  Since I saw her 5 months ago I did get a event monitor on 07/22/2021 that showed frequent PACs (2.2% burden) with runs of SVT.  I referred her to Dr. Lars Mage who did not feel she was necessarily a candidate for antiarrhythmic therapy or ablation given her minimal symptoms and the fact that she had moderate myasthenia gravis.  He thought that the medications could potentially interfere with that.  She had a lipid profile performed 08/29/2021 revealing total cholesterol 249, LDL 147 and HDL of 83.  She denies chest pain or shortness of breath.   Current Meds  Medication Sig   calcium acetate (PHOSLO) 667 MG tablet Take 667 mg by mouth 3 (three) times daily.   Cholecalciferol (VITAMIN D) 2000 UNITS CAPS Take 1 capsule by mouth every evening.    citalopram (CELEXA) 20 MG tablet Take 1 tablet by mouth daily.   entecavir (BARACLUDE) 0.5  MG tablet Take 0.5 mg by mouth daily.   Influenza vac split quadrivalent PF (FLUZONE HIGH-DOSE) 0.5 ML injection ADM 0.5ML IM UTD   levothyroxine (SYNTHROID) 25 MCG tablet TK 1 T PO QD   levothyroxine (SYNTHROID, LEVOTHROID) 112 MCG tablet Take 112 mcg by mouth daily before breakfast.   predniSONE (DELTASONE) 10 MG tablet Take 10 mg by mouth daily with breakfast.   Probiotic Product (PROBIOTIC DAILY PO) Take 1 capsule by mouth daily.   tretinoin (RETIN-A) 0.1 % cream      Allergies  Allergen Reactions   Codeine Nausea And Vomiting    Lightheaded also    Social History   Socioeconomic History   Marital status: Married    Spouse name: Not on file   Number of children: Not on file   Years of education: Not on file   Highest education level: Not on file  Occupational History   Not on file  Tobacco Use   Smoking status: Former    Packs/day: 0.25    Years: 3.00    Pack years: 0.75    Types: Cigarettes    Quit date: 12/11/1968    Years since quitting: 53.1   Smokeless tobacco: Never  Vaping Use   Vaping Use: Never used  Substance and Sexual Activity   Alcohol use: Yes    Comment: occasional   Drug use: No   Sexual activity: Not on  file  Other Topics Concern   Not on file  Social History Narrative   Not on file   Social Determinants of Health   Financial Resource Strain: Not on file  Food Insecurity: Not on file  Transportation Needs: Not on file  Physical Activity: Not on file  Stress: Not on file  Social Connections: Not on file  Intimate Partner Violence: Not on file     Review of Systems: General: negative for chills, fever, night sweats or weight changes.  Cardiovascular: negative for chest pain, dyspnea on exertion, edema, orthopnea, palpitations, paroxysmal nocturnal dyspnea or shortness of breath Dermatological: negative for rash Respiratory: negative for cough or wheezing Urologic: negative for hematuria Abdominal: negative for nausea, vomiting, diarrhea,  bright red blood per rectum, melena, or hematemesis Neurologic: negative for visual changes, syncope, or dizziness All other systems reviewed and are otherwise negative except as noted above.    Blood pressure (!) 146/72, pulse 69, height 5\' 4"  (1.626 m), weight 156 lb 3.2 oz (70.9 kg), SpO2 98 %.  General appearance: alert and no distress Neck: no adenopathy, no carotid bruit, no JVD, supple, symmetrical, trachea midline, and thyroid not enlarged, symmetric, no tenderness/mass/nodules Lungs: clear to auscultation bilaterally Heart: regular rate and rhythm, S1, S2 normal, no murmur, click, rub or gallop Extremities: extremities normal, atraumatic, no cyanosis or edema Pulses: 2+ and symmetric Skin: Skin color, texture, turgor normal. No rashes or lesions Neurologic: Grossly normal  EKG sinus rhythm at 70 without ST or T wave changes.  I personally reviewed this EKG.  ASSESSMENT AND PLAN:   Hypercholesterolemia History of hyperlipidemia not on statin therapy with lipid profile performed 08/29/2021 revealing 10 cholesterol 249, LDL 147 and HDL of 83.  I am Going to get a coronary calcium score to help determine aggressiveness of pharmacologic therapy.  Palpitations History of palpitations found to be PACs and SVT on event monitoring performed 07/22/2021.  Her PAC burden was 2.2%.  I did send her to Dr. Quentin Ore, EP, who did not feel that she would benefit from treatment given her myasthenia gravis.  We have talked about limiting her caffeine intake as well.     Lorretta Harp MD FACP,FACC,FAHA, [facility] 01/11/2022 11:54 AM

## 2022-01-11 NOTE — Assessment & Plan Note (Signed)
History of palpitations found to be PACs and SVT on event monitoring performed 07/22/2021.  Her PAC burden was 2.2%.  I did send her to Dr. Lalla Brothers, EP, who did not feel that she would benefit from treatment given her myasthenia gravis.  We have talked about limiting her caffeine intake as well.

## 2022-02-28 ENCOUNTER — Ambulatory Visit (INDEPENDENT_AMBULATORY_CARE_PROVIDER_SITE_OTHER)
Admission: RE | Admit: 2022-02-28 | Discharge: 2022-02-28 | Disposition: A | Discharge status: Home / Self Care | Payer: Self-pay | Source: Ambulatory Visit | Attending: Cardiovascular Disease | Admitting: Cardiovascular Disease

## 2022-02-28 ENCOUNTER — Other Ambulatory Visit: Payer: Self-pay

## 2022-02-28 DIAGNOSIS — E7800 Pure hypercholesterolemia, unspecified: Secondary | ICD-10-CM

## 2022-07-14 ENCOUNTER — Encounter: Payer: Self-pay | Admitting: Cardiovascular Disease

## 2022-07-14 ENCOUNTER — Ambulatory Visit: Payer: Federal, State, Local not specified - PPO | Admitting: Cardiovascular Disease

## 2022-07-14 VITALS — BP 128/66 | HR 67 | Ht 64.0 in | Wt 153.8 lb

## 2022-07-14 DIAGNOSIS — R002 Palpitations: Secondary | ICD-10-CM | POA: Diagnosis not present

## 2022-07-14 DIAGNOSIS — E7800 Pure hypercholesterolemia, unspecified: Secondary | ICD-10-CM

## 2022-07-14 NOTE — Progress Notes (Signed)
07/14/2022 Jean Jennings   [birth year]  [id number]  Primary Physician Alysia Penna, MD Primary Cardiologist: Runell Gess MD Nicholes Calamity, [place]  HPI:  Jean Jennings is a 81 y.o.  thin and fit appearing married Caucasian female mother of 3, grandmother 7 grandchildren who is retired from working in [employer].  She was referred by Dr. Link Snuffer for evaluation of symptomatic palpitations.  I last saw her in the office 01/11/2022  She has a positive family history for heart disease.  She has had palpitations for decades worse in the last 2 to 3 months after having 2 steroid injections in her foot for symptomatic plantar fasciitis by Dr. Ardelle Anton.  She had a recent event monitor that showed sinus rhythm/sinus bradycardia with occasional PVCs and PACs and a normal 2D echo.    She has developed myasthenia gravis in April of this year.  She was taken off of propranolol at that time and since then her palpitations have become more severe.  She was hospitalized at [facility] back in July.  2D echo was essentially normal as was a chest CT.  I did get a event monitor on 07/22/2021 that showed frequent PACs (2.2% burden) with runs of SVT.  I referred her to Dr. Steffanie Dunn who did not feel she was necessarily a candidate for antiarrhythmic therapy or ablation given her minimal symptoms and the fact that she had moderate myasthenia gravis.  He thought that the medications could potentially interfere with that.  She had a lipid profile performed 08/29/2021 revealing total cholesterol 249, LDL 147 and HDL of 83.  Since I saw her 6 months ago I did get a coronary calcium score and her 02/28/2022 which was 36.  Her most recent lipid profile performed/3/23 revealed total cholesterol 236, LDL 132 and HDL 71.  Her symptoms of myasthenia gravis have significantly improved which she is happy about although she still is symptomatic from her PACs.   Current Meds  Medication Sig   calcium  acetate (PHOSLO) 667 MG tablet Take 667 mg by mouth 3 (three) times daily.   Cholecalciferol (VITAMIN D) 2000 UNITS CAPS Take 1 capsule by mouth every evening.    citalopram (CELEXA) 20 MG tablet Take 1 tablet by mouth daily.   Cyanocobalamin (VITAMIN B12 PO)    entecavir (BARACLUDE) 0.5 MG tablet Take 0.5 mg by mouth daily.   predniSONE (DELTASONE) 5 MG tablet    Probiotic Product (PROBIOTIC DAILY PO) Take 1 capsule by mouth daily.   SYNTHROID 125 MCG tablet Take 125 mcg by mouth daily.   tretinoin (RETIN-A) 0.1 % cream    [DISCONTINUED] Influenza vac split quadrivalent PF (FLUZONE HIGH-DOSE) 0.5 ML injection ADM 0.5ML IM UTD     Allergies  Allergen Reactions   Codeine Nausea And Vomiting    Lightheaded also    Social History   Socioeconomic History   Marital status: Married    Spouse name: Not on file   Number of children: Not on file   Years of education: Not on file   Highest education level: Not on file  Occupational History   Not on file  Tobacco Use   Smoking status: Former    Packs/day: 0.25    Years: 3.00    Total pack years: 0.75    Types: Cigarettes    Quit date: 12/11/1968    Years since quitting: 53.6   Smokeless tobacco: Never  Vaping Use   Vaping Use:  Never used  Substance and Sexual Activity   Alcohol use: Yes    Comment: occasional   Drug use: No   Sexual activity: Not on file  Other Topics Concern   Not on file  Social History Narrative   Not on file   Social Determinants of Health   Financial Resource Strain: Not on file  Food Insecurity: Not on file  Transportation Needs: Not on file  Physical Activity: Not on file  Stress: Not on file  Social Connections: Not on file  Intimate Partner Violence: Not on file     Review of Systems: General: negative for chills, fever, night sweats or weight changes.  Cardiovascular: negative for chest pain, dyspnea on exertion, edema, orthopnea, palpitations, paroxysmal nocturnal dyspnea or shortness of  breath Dermatological: negative for rash Respiratory: negative for cough or wheezing Urologic: negative for hematuria Abdominal: negative for nausea, vomiting, diarrhea, bright red blood per rectum, melena, or hematemesis Neurologic: negative for visual changes, syncope, or dizziness All other systems reviewed and are otherwise negative except as noted above.    Blood pressure 128/66, pulse 67, height 5\' 4"  (1.626 m), weight 153 lb 12.8 oz (69.8 kg), SpO2 97 %.  General appearance: alert and no distress Neck: no adenopathy, no carotid bruit, no JVD, supple, symmetrical, trachea midline, and thyroid not enlarged, symmetric, no tenderness/mass/nodules Lungs: clear to auscultation bilaterally Heart: regular rate and rhythm, S1, S2 normal, no murmur, click, rub or gallop Extremities: extremities normal, atraumatic, no cyanosis or edema Pulses: 2+ and symmetric Skin: Skin color, texture, turgor normal. No rashes or lesions Neurologic: Grossly normal  EKG sinus rhythm at 67 without ST or T wave changes.  Personally reviewed this EKG.  ASSESSMENT AND PLAN:   Hypercholesterolemia History of hyperlipidemia not on statin therapy with lipid profile performed 03/13/22 revealing a total cholesterol 226, LDL 132 and HDL 71.  She does have a mildly elevated coronary calcium score of 36.  Given her history of myasthenia gravis which is improved on medication I am hesitant to start her on a statin drug.  Palpitations History of palpitations with PACs (2.2% burden) and runs of SVT.  She is symptomatic from this.  Because of her diagnosis of myasthenia, her neurologist does not want her to be on a beta-blocker.  She seen Dr. 05/13/22 in the past and has an appoint with him in the near future.     Steffanie Dunn MD FACP,FACC,FAHA, [facility] 07/14/2022 4:07 PM

## 2022-07-14 NOTE — Assessment & Plan Note (Signed)
History of hyperlipidemia not on statin therapy with lipid profile performed 03/13/22 revealing a total cholesterol 226, LDL 132 and HDL 71.  She does have a mildly elevated coronary calcium score of 36.  Given her history of myasthenia gravis which is improved on medication I am hesitant to start her on a statin drug.

## 2022-07-14 NOTE — Assessment & Plan Note (Signed)
History of palpitations with PACs (2.2% burden) and runs of SVT.  She is symptomatic from this.  Because of her diagnosis of myasthenia, her neurologist does not want her to be on a beta-blocker.  She seen Dr. Steffanie Dunn in the past and has an appoint with him in the near future.

## 2022-07-14 NOTE — Patient Instructions (Signed)

## 2022-07-18 NOTE — Progress Notes (Signed)
PCP:  Alysia Penna, MD Primary Cardiologist: Nanetta Batty, MD Electrophysiologist: Lanier Prude, MD   Jean Jennings is a 81 y.o. female seen today for Lanier Prude, MD for routine electrophysiology followup.  Since last being seen in our clinic the patient reports doing well overall. She does have palpitations most days, and they have been worse over the past couple of weeks. She has a slightly tremor that waxes and wanes. She denies chest pain or significant DOE.  Last received an infusion for her MG approximately a year ago, and wondering if it's time for another.  Past Medical History:  Diagnosis Date   Anemia    as a younger woman   Anxiety    Arthritis    Diarrhea last 3 to 4 months   stomach pain   Dysrhythmia 3-4 yrs ago   hx irregular heart beat, dr Earl Gala manages   GERD (gastroesophageal reflux disease)    Hypothyroidism    IBS (irritable bowel syndrome)    Myasthenia gravis (HCC)    PONV (postoperative nausea and vomiting)    " a little bit"   Past Surgical History:  Procedure Laterality Date   ABDOMINAL HYSTERECTOMY  1970's   arthroscopic surgery  oct 2012   left knee   COLONOSCOPY WITH PROPOFOL N/A 06/03/2013   Procedure: COLONOSCOPY WITH PROPOFOL;  Surgeon: Charolett Bumpers, MD;  Location: [facility];  Service: Endoscopy;  Laterality: N/A;   left wriist ligament surgery  sev yrs ago   LUMBAR LAMINECTOMY/DECOMPRESSION MICRODISCECTOMY Left 06/09/2015   Procedure: Left Lumbar two-three Microdiskectomy;  Surgeon: Tressie Stalker, MD;  Location: [facility];  Service: Neurosurgery;  Laterality: Left;  Left L2-3 Microdiskectomy   thryoid surgery  1963   partial   TONSILLECTOMY      Current Outpatient Medications  Medication Sig Dispense Refill   calcium acetate (PHOSLO) 667 MG tablet Take 667 mg by mouth 3 (three) times daily.     Cholecalciferol (VITAMIN D) 2000 UNITS CAPS Take 1 capsule by mouth every evening.      citalopram (CELEXA) 20 MG  tablet Take 1 tablet by mouth daily.     Cyanocobalamin (VITAMIN B12 PO) every other day. UNSURE OF DOSAGE     entecavir (BARACLUDE) 0.5 MG tablet Take 0.5 mg by mouth daily.     predniSONE (DELTASONE) 5 MG tablet Take 5 mg by mouth daily.     Probiotic Product (PROBIOTIC DAILY PO) Take 1 capsule by mouth daily.     SYNTHROID 125 MCG tablet Take 125 mcg by mouth daily.     tretinoin (RETIN-A) 0.1 % cream as needed.     No current facility-administered medications for this visit.    Allergies  Allergen Reactions   Codeine Nausea And Vomiting    Lightheaded also    Social History   Socioeconomic History   Marital status: Married    Spouse name: Not on file   Number of children: Not on file   Years of education: Not on file   Highest education level: Not on file  Occupational History   Not on file  Tobacco Use   Smoking status: Former    Packs/day: 0.25    Years: 3.00    Total pack years: 0.75    Types: Cigarettes    Quit date: 12/11/1968    Years since quitting: 53.6   Smokeless tobacco: Never  Vaping Use   Vaping Use: Never used  Substance and Sexual Activity   Alcohol  use: Yes    Comment: occasional   Drug use: No   Sexual activity: Not on file  Other Topics Concern   Not on file  Social History Narrative   Not on file   Social Determinants of Health   Financial Resource Strain: Not on file  Food Insecurity: Not on file  Transportation Needs: Not on file  Physical Activity: Not on file  Stress: Not on file  Social Connections: Not on file  Intimate Partner Violence: Not on file     Review of Systems: All other systems reviewed and are otherwise negative except as noted above.  Physical Exam: Vitals:   07/25/22 1103  BP: 138/80  Pulse: 60  SpO2: 97%  Weight: 151 lb 12.8 oz (68.9 kg)  Height: 5\' 4"  (1.626 m)    GEN- The patient is well appearing, alert and oriented x 3 today.   HEENT: normocephalic, atraumatic; sclera clear, conjunctiva pink;  hearing intact; oropharynx clear; neck supple, no JVP Lymph- no cervical lymphadenopathy Lungs- Clear to ausculation bilaterally, normal work of breathing.  No wheezes, rales, rhonchi Heart- Regular rate and rhythm, no murmurs, rubs or gallops, PMI not laterally displaced GI- soft, non-tender, non-distended, bowel sounds present, no hepatosplenomegaly Extremities- no clubbing, cyanosis, or edema; DP/PT/radial pulses 2+ bilaterally MS- no significant deformity or atrophy Skin- warm and dry, no rash or lesion Psych- euthymic mood, full affect Neuro- strength and sensation are intact  EKG is ordered. Personal review of EKG from today shows NSR at 60 bpm  Additional studies reviewed include: Previous EP office notes.   Assessment and Plan:  1. Palpitations 2. PACs CCB/BB contraindicated due to her diagnosis of Myasthenia gravis.  Symptoms have been worse in these past several hot weeks.   Source below states that propafenone can cause worsening MG symptoms within "hours".  No reports of flecainide, amiodarone, or tikosyn causing worsening of MG patients.  With thyroid dysfunction, would likely defer amiodarone; and no real indication for tikosyn at this time.  If she would like to try an AAD, would likely start with flecainide, and would make sure with Neurology they agree appropriate. Could eventually try amiodarone if needed with aggressive following/management of her thyroid.   [address], [place] U, [place] B, Rezania K. Drugs That Induce or Cause Deterioration of Myasthenia Gravis: An Update. J Clin Med. 2021 Apr 6;10(7):1537. doi: 10.3390/jcm10071537. PMID: 05-29-1984; [id number].  3. Myasthenia gravis Limites safe use of BB/CCB  Follow up with Dr. : [id number] in 6 months; Sooner with issues.   She is going to see how she does with the cooler weather prior to considering AAD.   Lalla Brothers, PA-C  07/25/22 11:16 AM

## 2022-07-25 ENCOUNTER — Encounter: Payer: Self-pay | Admitting: Student

## 2022-07-25 ENCOUNTER — Ambulatory Visit: Payer: Federal, State, Local not specified - PPO | Admitting: Student

## 2022-07-25 VITALS — BP 138/80 | HR 60 | Ht 64.0 in | Wt 151.8 lb

## 2022-07-25 DIAGNOSIS — R002 Palpitations: Secondary | ICD-10-CM | POA: Diagnosis not present

## 2022-07-25 DIAGNOSIS — G7000 Myasthenia gravis without (acute) exacerbation: Secondary | ICD-10-CM | POA: Diagnosis not present

## 2022-07-25 DIAGNOSIS — I491 Atrial premature depolarization: Secondary | ICD-10-CM

## 2022-07-25 NOTE — Patient Instructions (Signed)
Medication Instructions:  ?Your physician recommends that you continue on your current medications as directed. Please refer to the Current Medication list given to you today. ? ?*If you need a refill on your cardiac medications before your next appointment, please call your pharmacy* ? ? ?Lab Work: ?None ?If you have labs (blood work) drawn today and your tests are completely normal, you will receive your results only by: ?MyChart Message (if you have MyChart) OR ?A paper copy in the mail ?If you have any lab test that is abnormal or we need to change your treatment, we will call you to review the results. ? ? ?Follow-Up: ?At [facility], you and your health needs are our priority.  As part of our continuing mission to provide you with exceptional heart care, we have created designated Provider Care Teams.  These Care Teams include your primary Cardiologist (physician) and Advanced Practice Providers (APPs -  Physician Assistants and Nurse Practitioners) who all work together to provide you with the care you need, when you need it. ? ? ?Your next appointment:   ?6 month(s) ? ?The format for your next appointment:   ?In Person ? ?Provider:   ?Cameron Lambert, MD{ ?

## 2022-11-22 ENCOUNTER — Other Ambulatory Visit: Payer: Self-pay | Admitting: Internal Medicine

## 2022-11-22 DIAGNOSIS — Z1231 Encounter for screening mammogram for malignant neoplasm of breast: Secondary | ICD-10-CM

## 2022-12-26 ENCOUNTER — Other Ambulatory Visit: Payer: Self-pay | Admitting: Orthopaedic Surgery

## 2022-12-26 ENCOUNTER — Ambulatory Visit
Admission: RE | Admit: 2022-12-26 | Discharge: 2022-12-26 | Disposition: A | Discharge status: Home / Self Care | Payer: Federal, State, Local not specified - PPO | Source: Ambulatory Visit | Attending: Orthopaedic Surgery | Admitting: Orthopaedic Surgery

## 2022-12-26 DIAGNOSIS — S92355A Nondisplaced fracture of fifth metatarsal bone, left foot, initial encounter for closed fracture: Secondary | ICD-10-CM

## 2023-01-16 ENCOUNTER — Ambulatory Visit
Admission: RE | Admit: 2023-01-16 | Discharge: 2023-01-16 | Disposition: A | Discharge status: Home / Self Care | Payer: Federal, State, Local not specified - PPO | Source: Ambulatory Visit | Attending: Internal Medicine | Admitting: Internal Medicine

## 2023-01-16 DIAGNOSIS — Z1231 Encounter for screening mammogram for malignant neoplasm of breast: Secondary | ICD-10-CM

## 2023-02-05 NOTE — Progress Notes (Unsigned)
  Electrophysiology Office Follow up Visit Note:    Date:  02/05/2023   ID:  Jean Jennings, DOB Sep 01, 1941, MRN [id number]  PCP:  Jean Hatchet, MD  [facility] Cardiologist:  Jean Burow, MD  [facility] Electrophysiologist:  Jean Epley, MD    Interval History:    Jean Jennings is a 82 y.o. female who presents for a follow up visit.   I last saw her August 25, 2021 for PACs.  The patient has a history of myasthenia gravis and hyperlipidemia.  At the last appointment we discussed how her palpitations corresponded to PACs.  We recommended continuing conservative therapy.  She saw Jonni Sanger in follow-up on July 25, 2022.  With the warmer months at the time of that appointment she was feeling more palpitations.  They discussed using antiarrhythmic drugs but given her myasthenia diagnosis, options are limited.      Past medical, surgical, social and family history were reviewed.  ROS:   Please see the history of present illness.    All other systems reviewed and are negative.  EKGs/Labs/Other Studies Reviewed:    The following studies were reviewed today:  EKG:  The ekg ordered today demonstrates ***   Physical Exam:    VS:  There were no vitals taken for this visit.    Wt Readings from Last 3 Encounters:  07/25/22 151 lb 12.8 oz (68.9 kg)  07/14/22 153 lb 12.8 oz (69.8 kg)  01/11/22 156 lb 3.2 oz (70.9 kg)     GEN: *** Well nourished, well developed in no acute distress CARDIAC: ***RRR, no murmurs, rubs, gallops RESPIRATORY:  Clear to auscultation without rales, wheezing or rhonchi       ASSESSMENT:    No diagnosis found. PLAN:    In order of problems listed above:  #Palpitations #PACs  Avoid calcium channel blocker, beta-blocker, propafenone given myasthenia diagnosis.  Could consider flecainide, amiodarone or Tikosyn moving forward if she were to have worsening of her symptoms.  #Myasthenia gravis Follows with neurology       Total  time spent with patient today *** minutes. This includes reviewing records, evaluating the patient and coordinating care.    Signed, Lars Mage, MD, [facility], [facility] 02/05/2023 10:08 PM    Electrophysiology [facility]

## 2023-02-06 ENCOUNTER — Ambulatory Visit: Payer: Federal, State, Local not specified - PPO | Attending: Cardiology | Admitting: Cardiology

## 2023-02-06 ENCOUNTER — Ambulatory Visit (INDEPENDENT_AMBULATORY_CARE_PROVIDER_SITE_OTHER): Payer: Federal, State, Local not specified - PPO

## 2023-02-06 ENCOUNTER — Encounter: Payer: Self-pay | Admitting: Cardiology

## 2023-02-06 VITALS — BP 132/70 | HR 75 | Ht 64.0 in | Wt 143.0 lb

## 2023-02-06 DIAGNOSIS — I491 Atrial premature depolarization: Secondary | ICD-10-CM

## 2023-02-06 DIAGNOSIS — R002 Palpitations: Secondary | ICD-10-CM

## 2023-02-06 NOTE — Progress Notes (Unsigned)
Enrolled patient for a 7 day Zio XT monitor to be mailed to patients home.  

## 2023-02-06 NOTE — Progress Notes (Signed)
Electrophysiology Office Follow up Visit Note:    Date:  02/06/2023   ID:  Jean Jennings, DOB 07/17/1941, MRN [id number]  PCP:  Velna Hatchet, MD  [facility] Cardiologist:  Quay Burow, MD  [facility] Electrophysiologist:  Vickie Epley, MD    Interval History:    Jean Jennings is a 82 y.o. female who presents for a follow up visit.   I last saw her August 25, 2021 for PACs.  The patient has a history of myasthenia gravis and hyperlipidemia.  At the last appointment we discussed how her palpitations corresponded to PACs.  We recommended continuing conservative therapy.  She saw Jonni Sanger in follow-up on July 25, 2022.  With the warmer months at the time of that appointment she was feeling more palpitations.  They discussed using antiarrhythmic drugs but given her myasthenia diagnosis, options are limited.  Today, she states she was feeling good until last week when she started experiencing increased palpitations and tremors. She notes that she also "feels shaky on the inside." She has since tried taking Primidone but she developed significant daytime somnolence. Previously her propranolol was discontinued due to myasthenia gravis. Next month she is scheduled to follow up with her neurologist.  She states that her voice sounds quavery, which has never been a component of her essential tremors. She believes she would be able to yell.   She denies any chest pain, shortness of breath, or peripheral edema. No lightheadedness, headaches, syncope, orthopnea, or PND.     Past medical, surgical, social and family history were reviewed.  ROS:   Please see the history of present illness.    (+) Palpitations (+) Tremors (+) Quavery voice All other systems reviewed and are negative.  EKGs/Labs/Other Studies Reviewed:    The following studies were reviewed today:    Physical Exam:    VS:  BP 132/70   Pulse 75   Ht '5\' 4"'$  (1.626 m)   Wt 143 lb (64.9 kg)   SpO2 97%   BMI  24.55 kg/m     Wt Readings from Last 3 Encounters:  02/06/23 143 lb (64.9 kg)  07/25/22 151 lb 12.8 oz (68.9 kg)  07/14/22 153 lb 12.8 oz (69.8 kg)     GEN:  Well nourished, well developed in no acute distress CARDIAC: RRR, no murmurs, rubs, gallops RESPIRATORY:  Clear to auscultation without rales, wheezing or rhonchi       ASSESSMENT:    1. Palpitations   2. PAC (premature atrial contraction)    PLAN:    In order of problems listed above:  #Palpitations #PACs Currently I think the majority of her symptoms are related to her myasthenia and not cardiac in nature.  I would like her to wear a 7-day ZIO monitor to confirm no significant change in her burden of arrhythmia.  Avoid calcium channel blocker, beta-blocker, propafenone given myasthenia diagnosis.  Could consider flecainide, amiodarone or Tikosyn moving forward if she were to have worsening of her symptoms.   #Myasthenia gravis Follows with neurology, Dr Larose Kells at [facility] I have encouraged her to follow-up with her neurologist as soon as possible.  She should call her neurology office today to make an appointment.  She is noticing worsening tremors and tremors in her voice which were present before the initial diagnosis of myasthenia.  I have advised her that if she were to develop shortness of breath, trouble speaking, worsening in her speech or strength she should immediately present to the ER.  Follow up in 1 year.     I,Mathew Stumpf,acting as a Education administrator for Vickie Epley, MD.,have documented all relevant documentation on the behalf of Vickie Epley, MD,as directed by  Vickie Epley, MD while in the presence of Vickie Epley, MD.  I, Vickie Epley, MD, have reviewed all documentation for this visit. The documentation on 02/06/23 for the exam, diagnosis, procedures, and orders are all accurate and complete.   Signed, Lars Mage, MD, [facility], [facility] 02/06/2023 11:59 AM     Electrophysiology [facility]

## 2023-02-06 NOTE — Patient Instructions (Signed)
*Please call your neurologist today  Medication Instructions:  Your physician recommends that you continue on your current medications as directed. Please refer to the Current Medication list given to you today.  *If you need a refill on your cardiac medications before your next appointment, please call your pharmacy*  Testing/Procedures: Your physician has recommended that you wear an event monitor. Event monitors are medical devices that record the heart's electrical activity. Doctors most often [place] these monitors to diagnose arrhythmias. Arrhythmias are problems with the speed or rhythm of the heartbeat. The monitor is a small, portable device. You can wear one while you do your normal daily activities. This is usually used to diagnose what is causing palpitations/syncope (passing out).  Follow-Up: At [facility], you and your health needs are our priority.  As part of our continuing mission to provide you with exceptional heart care, we have created designated Provider Care Teams.  These Care Teams include your primary Cardiologist (physician) and Advanced Practice Providers (APPs -  Physician Assistants and Nurse Practitioners) who all work together to provide you with the care you need, when you need it.  Your next appointment:   1 year(s)  Provider:   You may see Vickie Epley, MD or one of the following Advanced Practice Providers on your designated Care Team:   Tommye Standard, [place] Legrand Como "Jonni Sanger" [place], [place] Mamie Levers, NP    Other Instructions [place] Monitor Instructions  Your physician has requested you wear a ZIO patch monitor for 7 days.  This is a single patch monitor. [employer] supplies one patch monitor per enrollment. Additional stickers are not available. Please do not apply patch if you will be having a Nuclear Stress Test,  Echocardiogram, Cardiac CT, MRI, or Chest Xray during the period you would be wearing the  monitor. The patch cannot be  worn during these tests. You cannot remove and re-apply the  ZIO XT patch monitor.  Your ZIO patch monitor will be mailed 3 day USPS to your address on file. It may take 3-5 days  to receive your monitor after you have been enrolled.  Once you have received your monitor, please review the enclosed instructions. Your monitor  has already been registered assigning a specific monitor serial # to you.  Billing and Patient Assistance Program Information  We have supplied [employer] with any of your insurance information on file for billing purposes. [employer] offers a sliding scale Patient Assistance Program for patients that do not have  insurance, or whose insurance does not completely cover the cost of the ZIO monitor.  You must apply for the Patient Assistance Program to qualify for this discounted rate.  To apply, please call [employer] at [phone], select option 4, select option 2, ask to apply for  Patient Assistance Program. Theodore Demark will ask your household income, and how many people  are in your household. They will quote your out-of-pocket cost based on that information.  [employer] will also be able to set up a 10-month interest-free payment plan if needed.  Applying the monitor   Shave hair from upper left chest.  Hold abrader disc by orange tab. Rub abrader in 40 strokes over the upper left chest as  indicated in your monitor instructions.  Clean area with 4 enclosed alcohol pads. Let dry.  Apply patch as indicated in monitor instructions. Patch will be placed under collarbone on left  side of chest with arrow pointing upward.  Rub patch adhesive wings for 2  minutes. Remove white label marked "1". Remove the white  label marked "2". Rub patch adhesive wings for 2 additional minutes.  While looking in a mirror, press and release button in center of patch. A small green light will  flash 3-4 times. This will be your only indicator that the monitor has been turned on.  Do not shower for  the first 24 hours. You may shower after the first 24 hours.  Press the button if you feel a symptom. You will hear a small click. Record Date, Time and  Symptom in the Patient Logbook.  When you are ready to remove the patch, follow instructions on the last 2 pages of Patient  Logbook. Stick patch monitor onto the last page of Patient Logbook.  Place Patient Logbook in the blue and white box. Use locking tab on box and tape box closed  securely. The blue and white box has prepaid postage on it. Please place it in the mailbox as  soon as possible. Your physician should have your test results approximately 7 days after the  monitor has been mailed back to [facility].  Call [facility] at [phone] if you have questions regarding  your ZIO XT patch monitor. Call them immediately if you see an orange light blinking on your  monitor.  If your monitor falls off in less than 4 days, contact our Monitor department at [phone].  If your monitor becomes loose or falls off after 4 days call [employer] at [phone] for  suggestions on securing your monitor

## 2023-02-15 DIAGNOSIS — I491 Atrial premature depolarization: Secondary | ICD-10-CM

## 2023-02-15 DIAGNOSIS — R002 Palpitations: Secondary | ICD-10-CM

## 2023-11-29 ENCOUNTER — Other Ambulatory Visit: Payer: Self-pay | Admitting: Orthopaedic Surgery

## 2023-11-29 DIAGNOSIS — S8254XA Nondisplaced fracture of medial malleolus of right tibia, initial encounter for closed fracture: Secondary | ICD-10-CM

## 2023-11-30 ENCOUNTER — Encounter: Payer: Self-pay | Admitting: Orthopaedic Surgery

## 2023-11-30 ENCOUNTER — Other Ambulatory Visit: Payer: Self-pay | Admitting: Orthopaedic Surgery

## 2023-11-30 ENCOUNTER — Ambulatory Visit
Admission: RE | Admit: 2023-11-30 | Discharge: 2023-11-30 | Disposition: A | Discharge status: Home / Self Care | Payer: Federal, State, Local not specified - PPO | Source: Ambulatory Visit | Attending: Orthopaedic Surgery | Admitting: Orthopaedic Surgery

## 2023-11-30 ENCOUNTER — Ambulatory Visit: Admission: RE | Admit: 2023-11-30 | Payer: Federal, State, Local not specified - PPO | Source: Ambulatory Visit

## 2023-11-30 DIAGNOSIS — S8254XA Nondisplaced fracture of medial malleolus of right tibia, initial encounter for closed fracture: Secondary | ICD-10-CM

## 2023-12-03 ENCOUNTER — Encounter (HOSPITAL_BASED_OUTPATIENT_CLINIC_OR_DEPARTMENT_OTHER): Payer: Self-pay | Admitting: Orthopaedic Surgery

## 2023-12-03 NOTE — Progress Notes (Signed)
Spoke w/ via phone for pre-op interview---Martisha Lab needs dos---- NONE. Surgeon orders requested 12/03/23.        Lab results------ COVID test -----patient states asymptomatic no test needed Arrive at -------0900 NPO after MN NO Solid Food.  Clear liquids from MN until---0800 Med rec completed Medications to take morning of surgery -----Synthroid and Celexa Diabetic medication ----- Patient instructed no nail polish to be worn day of surgery Patient instructed to bring photo id and insurance card day of surgery Patient aware to have Driver (ride ) / caregiver    for 24 hours after surgery - Husband Weber Cooks Patient Special Instructions ----- Pre-Op special Instructions ----- Patient verbalized understanding of instructions that were given at this phone interview. Patient denies chest pain, sob, fever, cough at the interview.

## 2023-12-04 NOTE — H&P (Signed)
ORTHOPAEDIC SURGERY H&P  Subjective:  The patient presents with right ankle fx.   Past Medical History:  Diagnosis Date   Anemia    as a younger woman   Anxiety    Arthritis    Diarrhea last 3 to 4 months   stomach pain   Dysrhythmia 3-4 yrs ago   hx irregular heart beat, dr Earl Gala manages   GERD (gastroesophageal reflux disease)    Hypothyroidism    IBS (irritable bowel syndrome)    Myasthenia gravis (HCC)    PONV (postoperative nausea and vomiting)    " a little bit"    Past Surgical History:  Procedure Laterality Date   ABDOMINAL HYSTERECTOMY  1970's   arthroscopic surgery  oct 2012   left knee   COLONOSCOPY WITH PROPOFOL N/A 06/03/2013   Procedure: COLONOSCOPY WITH PROPOFOL;  Surgeon: Charolett Bumpers, MD;  Location: [facility];  Service: Endoscopy;  Laterality: N/A;   left wriist ligament surgery  sev yrs ago   LUMBAR LAMINECTOMY/DECOMPRESSION MICRODISCECTOMY Left 06/09/2015   Procedure: Left Lumbar two-three Microdiskectomy;  Surgeon: Tressie Stalker, MD;  Location: [facility];  Service: Neurosurgery;  Laterality: Left;  Left L2-3 Microdiskectomy   thryoid surgery  1963   partial   TONSILLECTOMY       (Not in an outpatient encounter)    Allergies  Allergen Reactions   Codeine Nausea And Vomiting    Lightheaded also    Social History   Socioeconomic History   Marital status: Married    Spouse name: Not on file   Number of children: Not on file   Years of education: Not on file   Highest education level: Not on file  Occupational History   Not on file  Tobacco Use   Smoking status: Former    Current packs/day: 0.00    Average packs/day: 0.3 packs/day for 3.0 years (0.8 ttl pk-yrs)    Types: Cigarettes    Start date: 12/11/1965    Quit date: 12/11/1968    Years since quitting: 55.0   Smokeless tobacco: Never  Vaping Use   Vaping status: Never Used  Substance and Sexual Activity   Alcohol use: Yes    Comment: occasional   Drug use: No    Sexual activity: Not on file  Other Topics Concern   Not on file  Social History Narrative   Not on file   Social Drivers of Health   Financial Resource Strain: Low Risk  (03/27/2023)   Received from [facility], [facility]   Overall Financial Resource Strain (CARDIA)    Difficulty of Paying Living Expenses: Not hard at all  Food Insecurity: No Food Insecurity (03/27/2023)   Received from [facility], [facility]   Hunger Vital Sign    Worried About Running Out of Food in the Last Year: Never true    Ran Out of Food in the Last Year: Never true  Transportation Needs: No Transportation Needs (03/27/2023)   Received from [facility], [facility]   PRAPARE - Transportation    Lack of Transportation (Medical): No    Lack of Transportation (Non-Medical): No  Physical Activity: Insufficiently Active (03/27/2023)   Received from [facility], [facility]   Exercise Vital Sign    Days of Exercise per Week: 3 days    Minutes of Exercise per Session: 20 min  Stress: Patient Declined (03/27/2023)   Received from [facility], [facility] of Occupational Health - Occupational Stress Questionnaire  Feeling of Stress : Patient declined  Social Connections: Socially Integrated (03/27/2023)   Received from [facility], [facility]   Social Network    How would you rate your social network (family, work, friends)?: Good participation with social networks  Intimate Partner Violence: Not At Risk (03/27/2023)   Received from [facility], [facility]   HITS    Over the last 12 months how often did your partner physically hurt you?: Never    Over the last 12 months how often did your partner insult you or talk down to you?: Never    Over the last 12 months how often did your partner threaten you with physical harm?: Never    Over the last 12 months how often did your partner scream or curse at you?: Never     History reviewed. No pertinent family  history.   Review of Systems Pertinent items are noted in HPI.  Objective: Vital signs in last 24 hours:    12/03/2023    9:08 AM 02/06/2023   11:25 AM 07/25/2022   11:03 AM  Vitals with BMI  Height 5\' 4"  5\' 4"  5\' 4"   Weight 140 lbs 143 lbs 151 lbs 13 oz  BMI 24.02 24.53 26.04  Systolic  132 138  Diastolic  70 80  Pulse  75 60      EXAM: General: Well nourished, well developed. Awake, alert and oriented to time, place, person. Normal mood and affect. No apparent distress. Breathing room air.  Operative Lower Extremity: Alignment - Neutral Deformity - None Skin intact Tenderness to palpation - right ankle  5/5 TA, PT, GS, Per, EHL, FHL Sensation intact to light touch throughout Palpable DP and PT pulses Special testing: None  The contralateral foot/ankle was examined for comparison and noted to be neurovascularly intact with no localized deformity, swelling, or tenderness.  Imaging Review All images taken were independently reviewed by me.  Assessment/Plan: The clinical and radiographic findings were reviewed and discussed at length with the patient.  The patient has right ankle fx.  We spoke at length about the natural course of these findings. We discussed nonoperative and operative treatment options in detail.  The risks and benefits were presented and reviewed. The risks due to hardware failure/irritation, new/persistent/recurrent infection, stiffness, nerve/vessel/tendon injury, nonunion/malunion of any fracture, wound healing issues, allograft usage, development of arthritis, failure of this surgery, possibility of external fixation in certain situations, possibility of delayed definitive surgery, need for further surgery, prolonged wound care including further soft tissue coverage procedures, thromboembolic events, anesthesia/medical complications/events perioperatively and beyond, amputation, death among others were discussed. The patient acknowledged the  explanation and agreed to proceed with the plan.  Netta Cedars  Orthopaedic Surgery [facility]

## 2023-12-04 NOTE — Discharge Instructions (Signed)

## 2023-12-06 ENCOUNTER — Other Ambulatory Visit: Payer: Federal, State, Local not specified - PPO

## 2023-12-06 ENCOUNTER — Encounter (HOSPITAL_BASED_OUTPATIENT_CLINIC_OR_DEPARTMENT_OTHER): Payer: Self-pay | Admitting: Orthopaedic Surgery

## 2023-12-06 ENCOUNTER — Encounter (HOSPITAL_BASED_OUTPATIENT_CLINIC_OR_DEPARTMENT_OTHER)
Admission: RE | Disposition: A | Discharge status: Home / Self Care | Payer: Self-pay | Source: Home / Self Care | Attending: Orthopaedic Surgery

## 2023-12-06 ENCOUNTER — Ambulatory Visit (HOSPITAL_BASED_OUTPATIENT_CLINIC_OR_DEPARTMENT_OTHER)
Admission: RE | Admit: 2023-12-06 | Discharge: 2023-12-06 | Disposition: A | Discharge status: Home / Self Care | Payer: Federal, State, Local not specified - PPO | Attending: Orthopaedic Surgery | Admitting: Orthopaedic Surgery

## 2023-12-06 ENCOUNTER — Other Ambulatory Visit: Payer: Self-pay

## 2023-12-06 ENCOUNTER — Ambulatory Visit (HOSPITAL_BASED_OUTPATIENT_CLINIC_OR_DEPARTMENT_OTHER): Payer: Federal, State, Local not specified - PPO | Admitting: Anesthesiology

## 2023-12-06 ENCOUNTER — Ambulatory Visit (HOSPITAL_COMMUNITY): Payer: Federal, State, Local not specified - PPO

## 2023-12-06 DIAGNOSIS — X58XXXA Exposure to other specified factors, initial encounter: Secondary | ICD-10-CM | POA: Insufficient documentation

## 2023-12-06 DIAGNOSIS — S82871A Displaced pilon fracture of right tibia, initial encounter for closed fracture: Secondary | ICD-10-CM | POA: Diagnosis present

## 2023-12-06 DIAGNOSIS — Z87891 Personal history of nicotine dependence: Secondary | ICD-10-CM | POA: Insufficient documentation

## 2023-12-06 HISTORY — PX: ORIF ANKLE FRACTURE: SHX5408

## 2023-12-06 LAB — SURGICAL PCR SCREEN
MRSA, PCR: NEGATIVE
Staphylococcus aureus: POSITIVE — AB

## 2023-12-06 SURGERY — OPEN REDUCTION INTERNAL FIXATION (ORIF) ANKLE FRACTURE
Anesthesia: General | Site: Ankle | Laterality: Right

## 2023-12-06 MED ORDER — LIDOCAINE 2% (20 MG/ML) 5 ML SYRINGE
INTRAMUSCULAR | Status: DC | PRN
  Administered 2023-12-06: 60 mg via INTRAVENOUS

## 2023-12-06 MED ORDER — FENTANYL CITRATE (PF) 100 MCG/2ML IJ SOLN
INTRAMUSCULAR | Status: AC
  Filled 2023-12-06: qty 2

## 2023-12-06 MED ORDER — STERILE WATER FOR IRRIGATION IR SOLN
Status: DC | PRN
  Administered 2023-12-06: 500 mL

## 2023-12-06 MED ORDER — CEFAZOLIN SODIUM-DEXTROSE 2-4 GM/100ML-% IV SOLN
2.0000 g | INTRAVENOUS | Status: AC
  Administered 2023-12-06: 2 g via INTRAVENOUS

## 2023-12-06 MED ORDER — DEXAMETHASONE SODIUM PHOSPHATE 10 MG/ML IJ SOLN
INTRAMUSCULAR | Status: AC
  Filled 2023-12-06: qty 1

## 2023-12-06 MED ORDER — PROPOFOL 10 MG/ML IV BOLUS
INTRAVENOUS | Status: AC
  Filled 2023-12-06: qty 20

## 2023-12-06 MED ORDER — FENTANYL CITRATE (PF) 100 MCG/2ML IJ SOLN: 25.0000 ug | INTRAMUSCULAR | Status: DC | PRN

## 2023-12-06 MED ORDER — ONDANSETRON HCL 4 MG/2ML IJ SOLN: 4.0000 mg | Freq: Once | INTRAMUSCULAR | Status: DC | PRN

## 2023-12-06 MED ORDER — MIDAZOLAM HCL 2 MG/2ML IJ SOLN
INTRAMUSCULAR | Status: AC
Start: 2023-12-06 — End: ?
  Filled 2023-12-06: qty 2

## 2023-12-06 MED ORDER — SODIUM CHLORIDE 0.9 % IV SOLN: INTRAVENOUS | Status: DC

## 2023-12-06 MED ORDER — ONDANSETRON HCL 4 MG/2ML IJ SOLN
INTRAMUSCULAR | Status: DC | PRN
  Administered 2023-12-06: 4 mg via INTRAVENOUS

## 2023-12-06 MED ORDER — OXYCODONE HCL 5 MG PO TABS: 5.0000 mg | ORAL_TABLET | Freq: Once | ORAL | Status: DC | PRN

## 2023-12-06 MED ORDER — PROPOFOL 10 MG/ML IV BOLUS
INTRAVENOUS | Status: DC | PRN
  Administered 2023-12-06: 150 mg via INTRAVENOUS

## 2023-12-06 MED ORDER — CLONIDINE HCL (ANALGESIA) 100 MCG/ML EP SOLN
EPIDURAL | Status: DC | PRN
  Administered 2023-12-06 (×2): 100 ug

## 2023-12-06 MED ORDER — OXYCODONE HCL 5 MG/5ML PO SOLN: 5.0000 mg | Freq: Once | ORAL | Status: DC | PRN

## 2023-12-06 MED ORDER — LIDOCAINE-EPINEPHRINE 2 %-1:100000 IJ SOLN
INTRAMUSCULAR | Status: DC | PRN
  Administered 2023-12-06: 10 mL via PERINEURAL

## 2023-12-06 MED ORDER — CEFAZOLIN SODIUM-DEXTROSE 2-4 GM/100ML-% IV SOLN
INTRAVENOUS | Status: AC
  Filled 2023-12-06: qty 100

## 2023-12-06 MED ORDER — CHLORHEXIDINE GLUCONATE 4 % EX SOLN: 60.0000 mL | Freq: Once | CUTANEOUS | Status: DC

## 2023-12-06 MED ORDER — VANCOMYCIN HCL 500 MG IV SOLR
INTRAVENOUS | Status: DC | PRN
  Administered 2023-12-06: 1000 mg

## 2023-12-06 MED ORDER — DEXAMETHASONE SODIUM PHOSPHATE 4 MG/ML IJ SOLN
INTRAMUSCULAR | Status: DC | PRN
  Administered 2023-12-06: 4 mg via INTRAVENOUS

## 2023-12-06 MED ORDER — MUPIROCIN 2 % EX OINT
1.0000 | TOPICAL_OINTMENT | Freq: Two times a day (BID) | CUTANEOUS | Status: DC
  Filled 2023-12-06: qty 22

## 2023-12-06 MED ORDER — ROPIVACAINE HCL 5 MG/ML IJ SOLN
INTRAMUSCULAR | Status: DC | PRN
Start: 2023-12-06 — End: 2023-12-06
  Administered 2023-12-06: 20 mL via PERINEURAL

## 2023-12-06 MED ORDER — BUPIVACAINE HCL (PF) 0.5 % IJ SOLN
INTRAMUSCULAR | Status: DC | PRN
  Administered 2023-12-06: 20 mL via PERINEURAL

## 2023-12-06 MED ORDER — ONDANSETRON HCL 4 MG/2ML IJ SOLN
INTRAMUSCULAR | Status: AC
  Filled 2023-12-06: qty 4

## 2023-12-06 MED ORDER — FENTANYL CITRATE (PF) 100 MCG/2ML IJ SOLN
50.0000 ug | Freq: Once | INTRAMUSCULAR | Status: AC
  Administered 2023-12-06: 50 ug via INTRAVENOUS

## 2023-12-06 SURGICAL SUPPLY — 63 items
2.0 drill long ×1 IMPLANT
BANDAGE ESMARK 6X9 LF (GAUZE/BANDAGES/DRESSINGS) ×2 IMPLANT
BIT DRILL 2 LNG CALIBR (DRILL) ×1 IMPLANT
BIT DRILL 2.5 CANN STRL (BIT) ×1 IMPLANT
BLADE SURG 15 STRL LF DISP TIS (BLADE) ×4 IMPLANT
BNDG COHESIVE 4X5 TAN STRL LF (GAUZE/BANDAGES/DRESSINGS) IMPLANT
BNDG ELASTIC 6X15 VLCR STRL LF (GAUZE/BANDAGES/DRESSINGS) ×2 IMPLANT
BNDG ESMARK 6X9 LF (GAUZE/BANDAGES/DRESSINGS) ×2 IMPLANT
BNDG GAUZE DERMACEA FLUFF 4 (GAUZE/BANDAGES/DRESSINGS) ×2 IMPLANT
BRUSH SCRUB EZ 4% CHG (MISCELLANEOUS) ×2 IMPLANT
CHLORAPREP W/TINT 26 (MISCELLANEOUS) ×4 IMPLANT
CNTNR URN SCR LID CUP LEK RST (MISCELLANEOUS) ×1 IMPLANT
COVER BACK TABLE 60X90IN (DRAPES) ×2 IMPLANT
CUFF TRNQT CYL 24X4X16.5-23 (TOURNIQUET CUFF) ×1 IMPLANT
CUFF TRNQT CYL 34X4.125X (TOURNIQUET CUFF) ×2 IMPLANT
DRAPE C-ARM 42X120 X-RAY (DRAPES) ×2 IMPLANT
DRAPE C-ARMOR (DRAPES) ×2 IMPLANT
DRAPE EXTREMITY T 121X128X90 (DISPOSABLE) ×2 IMPLANT
DRAPE IMP U-DRAPE 54X76 (DRAPES) ×2 IMPLANT
DRAPE SHEET LG 3/4 BI-LAMINATE (DRAPES) ×2 IMPLANT
DRAPE U-SHAPE 47X51 STRL (DRAPES) ×2 IMPLANT
DRSG MEPITEL 4X7.2 (GAUZE/BANDAGES/DRESSINGS) ×2 IMPLANT
DRSG MEPITEL 8X12 (GAUZE/BANDAGES/DRESSINGS) ×1 IMPLANT
ELECT REM PT RETURN 9FT ADLT (ELECTROSURGICAL) ×2 IMPLANT
ELECTRODE REM PT RTRN 9FT ADLT (ELECTROSURGICAL) ×2 IMPLANT
GAUZE PAD ABD 8X10 STRL (GAUZE/BANDAGES/DRESSINGS) ×10 IMPLANT
GAUZE SPONGE 4X4 12PLY STRL (GAUZE/BANDAGES/DRESSINGS) ×2 IMPLANT
GAUZE SPONGE 4X4 12PLY STRL LF (GAUZE/BANDAGES/DRESSINGS) ×1 IMPLANT
GLOVE BIOGEL PI IND STRL 8 (GLOVE) ×2 IMPLANT
GLOVE SURG SS PI 7.5 STRL IVOR (GLOVE) ×4 IMPLANT
GOWN STRL REUS W/ TWL LRG LVL3 (GOWN DISPOSABLE) ×2 IMPLANT
K-WIRE BB-TAK (WIRE) ×2 IMPLANT
KWIRE BB-TAK (WIRE) ×1 IMPLANT
MARKER SKIN DUAL TIP RULER LAB (MISCELLANEOUS) IMPLANT
NDL HYPO 25X1 1.5 SAFETY (NEEDLE) IMPLANT
NEEDLE HYPO 25X1 1.5 SAFETY (NEEDLE) IMPLANT
NS IRRIG 1000ML POUR BTL (IV SOLUTION) ×2 IMPLANT
PACK BASIN DAY SURGERY FS (CUSTOM PROCEDURE TRAY) ×2 IMPLANT
PADDING CAST ABS COTTON 4X4 ST (CAST SUPPLIES) IMPLANT
PADDING CAST SYNTHETIC 4X4 STR (CAST SUPPLIES) ×7 IMPLANT
PADDING CAST SYNTHETIC 6X4 NS (CAST SUPPLIES) ×5 IMPLANT
PENCIL SMOKE EVACUATOR (MISCELLANEOUS) ×1 IMPLANT
PLATE SHEER VERTICAL 4H (Plate) ×1 IMPLANT
SCREW KREULOCK 3.0X10 (Screw) ×2 IMPLANT
SCREW LOCK 3.5X24 (Screw) ×1 IMPLANT
SCREW LP TIT 3.5X36 (Screw) ×1 IMPLANT
SLEEVE SCD COMPRESS KNEE MED (STOCKING) ×2 IMPLANT
SPIKE FLUID TRANSFER (MISCELLANEOUS) IMPLANT
SPLINT FIBERGLASS 4X30 (CAST SUPPLIES) ×2 IMPLANT
SPLINT PLASTER CAST FAST 5X30 (CAST SUPPLIES) ×20 IMPLANT
SPONGE T-LAP 18X18 ~~LOC~~+RFID (SPONGE) ×1 IMPLANT
STOCKINETTE 6 STRL (DRAPES) ×2 IMPLANT
SUCTION TUBE FRAZIER 10FR DISP (SUCTIONS) ×1 IMPLANT
SUT ETHILON 2 0 FS 18 (SUTURE) ×2 IMPLANT
SUT MNCRL AB 3-0 PS2 18 (SUTURE) ×1 IMPLANT
SUT VIC AB 0 SH 27 (SUTURE) ×1 IMPLANT
SUT VIC AB 2-0 SH 27XBRD (SUTURE) ×1 IMPLANT
SUT VIC AB 3-0 SH 27X BRD (SUTURE) ×1 IMPLANT
SYR BULB EAR ULCER 3OZ GRN STR (SYRINGE) ×2 IMPLANT
SYR CONTROL 10ML LL (SYRINGE) IMPLANT
TOWEL OR 17X24 6PK STRL BLUE (TOWEL DISPOSABLE) ×3 IMPLANT
TUBE CONNECTING 12X1/4 (SUCTIONS) ×2 IMPLANT
UNDERPAD 30X36 HEAVY ABSORB (UNDERPADS AND DIAPERS) ×2 IMPLANT

## 2023-12-06 NOTE — Anesthesia Procedure Notes (Signed)
Anesthesia Regional Block: Popliteal block   Pre-Anesthetic Checklist: , timeout performed,  Correct Patient, Correct Site, Correct Laterality,  Correct Procedure, Correct Position, site marked,  Risks and benefits discussed,  Surgical consent,  Pre-op evaluation,  At surgeon's request and post-op pain management  Laterality: Right  Prep: chloraprep       Needles:  Injection technique: Single-shot  Needle Type: Echogenic Needle     Needle Length: 9cm      Additional Needles:   Procedures:,,,, ultrasound used (permanent image in chart),,    Narrative:  Start time: 12/06/2023 1:29 PM End time: 12/06/2023 1:34 PM Injection made incrementally with aspirations every 5 mL.  Performed by: Personally  Anesthesiologist: Eilene Ghazi, MD  Additional Notes: Patient tolerated the procedure well without complications

## 2023-12-06 NOTE — Anesthesia Procedure Notes (Signed)
Anesthesia Procedure Image    

## 2023-12-06 NOTE — Anesthesia Procedure Notes (Signed)
Procedure Name: LMA Insertion Date/Time: 12/06/2023 1:24 PM  Performed by: Burna Cash, CRNAPre-anesthesia Checklist: Patient identified, Emergency Drugs available, Suction available and Patient being monitored Patient Re-evaluated:Patient Re-evaluated prior to induction Oxygen Delivery Method: Circle system utilized Preoxygenation: Pre-oxygenation with 100% oxygen Induction Type: IV induction Ventilation: Mask ventilation without difficulty LMA: LMA inserted LMA Size: 4.0 Number of attempts: 1 Airway Equipment and Method: Bite block Placement Confirmation: positive ETCO2 Tube secured with: Tape Dental Injury: Teeth and Oropharynx as per pre-operative assessment

## 2023-12-06 NOTE — Progress Notes (Signed)
Called and spoke w/ the patient whom surgery has been moved from 12-11-2023 to today 12-06-2023.  Patient stated she had received call from Dr Odis Hollingshead office and aware of change of date of procedure and arrive at 1000.  Asked when patient eaten any food patient stated yesterday and stated only had water since midnight with with taking medication. Patient had no further questions.

## 2023-12-06 NOTE — Anesthesia Procedure Notes (Signed)
Anesthesia Regional Block: Adductor canal block   Pre-Anesthetic Checklist: , timeout performed,  Correct Patient, Correct Site, Correct Laterality,  Correct Procedure, Correct Position, site marked,  Risks and benefits discussed,  Surgical consent,  Pre-op evaluation,  At surgeon's request and post-op pain management  Laterality: Right  Prep: chloraprep       Needles:  Injection technique: Single-shot  Needle Type: Echogenic Needle     Needle Length: 9cm      Additional Needles:   Procedures:,,,, ultrasound used (permanent image in chart),,    Narrative:  Start time: 12/06/2023 1:35 PM End time: 12/06/2023 1:38 PM Injection made incrementally with aspirations every 5 mL.  Performed by: Personally  Anesthesiologist: Eilene Ghazi, MD  Additional Notes: Patient tolerated the procedure well without complications

## 2023-12-06 NOTE — Anesthesia Postprocedure Evaluation (Signed)
Anesthesia Post Note  Patient: Jean Jennings  Procedure(s) Performed: OPEN REDUCTION INTERNAL FIXATION (ORIF)  PILON ANKLE FRACTURE WITHOUT INTERNAL FIXATION OF FIBULA (Right: Ankle)     Patient location during evaluation: PACU Anesthesia Type: General and Regional Level of consciousness: awake and alert Pain management: pain level controlled Vital Signs Assessment: post-procedure vital signs reviewed and stable Respiratory status: spontaneous breathing, nonlabored ventilation, respiratory function stable and patient connected to nasal cannula oxygen Cardiovascular status: blood pressure returned to baseline and stable Postop Assessment: no apparent nausea or vomiting Anesthetic complications: no   No notable events documented.  Last Vitals:  Vitals:   12/06/23 1430 12/06/23 1500  BP: (!) 106/50 (!) 110/52  Pulse:  78  Resp:  18  Temp: 36.8 C 36.7 C  SpO2: 95% 97%    Last Pain:  Vitals:   12/06/23 1500  TempSrc:   PainSc: 0-No pain                 Earl Lites P Nidal Rivet

## 2023-12-06 NOTE — Progress Notes (Signed)
Assisted Dr. Okey Dupre with popliteal/saphenous block. Side rails up, monitors on throughout procedure. See vital signs in flow sheet. Tolerated Procedure well.

## 2023-12-06 NOTE — H&P (Signed)

## 2023-12-06 NOTE — Op Note (Signed)
12/06/2023  1:01 PM   PATIENT: Jean Jennings  82 y.o. female  MRN: [id number]   PRE-OPERATIVE DIAGNOSIS:   Closed comminuted intra-articular fracture of right distal tibia plafond/pilon   POST-OPERATIVE DIAGNOSIS:   Same   PROCEDURE: ORIF right distal tibia plafond/pilon without internal fixation of fibula   SURGEON:  Netta Cedars, MD   ASSISTANT: None   ANESTHESIA: General, regional   EBL: Minimal   TOURNIQUET:   * Missing tourniquet times found for documented tourniquets in log: [id number] *   COMPLICATIONS: None apparent   DISPOSITION: Extubated, awake and stable to recovery.   INDICATION FOR PROCEDURE: The patient presented with above diagnosis.  We discussed the diagnosis, alternative treatment options, risks and benefits of the above surgical intervention, as well as alternative non-operative treatments. All questions/concerns were addressed and the patient/family demonstrated appropriate understanding of the diagnosis, the procedure, the postoperative course, and overall prognosis. The patient wished to proceed with surgical intervention and signed an informed surgical consent as such, in each others presence prior to surgery.   PROCEDURE IN DETAIL: After preoperative consent was obtained and the correct operative site was identified, the patient was brought to the operating room supine on stretcher and transferred onto operating table. General anesthesia was induced. Preoperative antibiotics were administered. Surgical timeout was taken. The patient was then positioned supine with an ipsilateral hip bump. The operative lower extremity was prepped and draped in standard sterile fashion with a tourniquet around the thigh. The extremity was exsanguinated and the tourniquet was inflated to 275 mmHg.  We then made a direct medial ankle approach and extended this proximally in anticipation of implanting a locking pilon plate. Dissection was carried down to the  level of the medial pilon fragment. A dental pick and freer elevator were used to reduce the medial malleolar fragment. Of note, there was noted to be very poor bone quality. An Arthrex medial distal tibia locking plate was utilized to fix the reduced pilon. The plate was oriented to best capture the major fragments of the fracture. We subsequently implanted screws proximally and distally to definitively secure the plate.  The surgical sites were thoroughly irrigated. The tourniquet was deflated and hemostasis achieved. Betadine and vancomycin powder were applied. The deep layers were closed using 2-0 vicryl. The skin was closed without tension.    The leg was cleaned with saline and sterile dressings with gauze were applied. A well padded bulky short leg splint was applied. The patient was awakened from anesthesia and transported to the recovery room in stable condition.    FOLLOW UP PLAN: -transfer to PACU, then home -strict NWB operative extremity, maximum elevation -maintain short leg splint until follow up -DVT ppx: Aspirin 81 mg twice daily while NWB -follow up as outpatient within 7-10 days for wound check with exchange of short leg splint to short leg cast -sutures out in 2-3 weeks in outpatient office   RADIOGRAPHS: AP, lateral, oblique and stress radiographs of the right ankle were obtained intraoperatively. These showed interval reduction and fixation of the fractures. Manual stress radiographs were taken and the joints were noted to be stable following fixation. All hardware is appropriately positioned and of the appropriate lengths. No other acute injuries are noted.   Netta Cedars Orthopaedic Surgery [facility]

## 2023-12-06 NOTE — Anesthesia Preprocedure Evaluation (Signed)
Anesthesia Evaluation  Patient identified by MRN, date of birth, ID band Patient awake    Reviewed: Allergy & Precautions, H&P , NPO status , Patient's Chart, lab work & pertinent test results  Airway Mallampati: II  TM Distance: >3 FB Neck ROM: Full    Dental no notable dental hx.    Pulmonary neg pulmonary ROS, former smoker   Pulmonary exam normal breath sounds clear to auscultation       Cardiovascular negative cardio ROS Normal cardiovascular exam Rhythm:Regular Rate:Normal     Neuro/Psych MG  Neuromuscular disease  negative psych ROS   GI/Hepatic Neg liver ROS,GERD  ,,  Endo/Other  Hypothyroidism    Renal/GU negative Renal ROS  negative genitourinary   Musculoskeletal negative musculoskeletal ROS (+)    Abdominal   Peds negative pediatric ROS (+)  Hematology negative hematology ROS (+)   Anesthesia Other Findings   Reproductive/Obstetrics negative OB ROS                             Anesthesia Physical Anesthesia Plan  ASA: 3  Anesthesia Plan: General   Post-op Pain Management: Regional block*   Induction: Intravenous  PONV Risk Score and Plan: 3 and Ondansetron, Dexamethasone and Treatment may vary due to age or medical condition  Airway Management Planned: LMA  Additional Equipment:   Intra-op Plan:   Post-operative Plan: Extubation in OR  Informed Consent: I have reviewed the patients History and Physical, chart, labs and discussed the procedure including the risks, benefits and alternatives for the proposed anesthesia with the patient or authorized representative who has indicated his/her understanding and acceptance.     Dental advisory given  Plan Discussed with: CRNA and Surgeon  Anesthesia Plan Comments:        Anesthesia Quick Evaluation

## 2023-12-06 NOTE — Transfer of Care (Signed)
Immediate Anesthesia Transfer of Care Note  Patient: Jean Jennings  Procedure(s) Performed: OPEN REDUCTION INTERNAL FIXATION (ORIF)  PILON ANKLE FRACTURE WITHOUT INTERNAL FIXATION OF FIBULA (Right: Ankle)  Patient Location: PACU  Anesthesia Type:General and Regional  Level of Consciousness: awake, alert , and oriented  Airway & Oxygen Therapy: Patient Spontanous Breathing and Patient connected to nasal cannula oxygen  Post-op Assessment: Report given to RN and Post -op Vital signs reviewed and stable  Post vital signs: Reviewed and stable  Last Vitals:  Vitals Value Taken Time  BP 115/52 12/06/23 1412  Temp    Pulse 73 12/06/23 1413  Resp 19 12/06/23 1413  SpO2 100 % 12/06/23 1413  Vitals shown include unfiled device data.  Last Pain:  Vitals:   12/06/23 1245  TempSrc:   PainSc: 0-No pain         Complications: No notable events documented.

## 2023-12-19 ENCOUNTER — Encounter (HOSPITAL_BASED_OUTPATIENT_CLINIC_OR_DEPARTMENT_OTHER): Payer: Self-pay | Admitting: Orthopaedic Surgery

## 2023-12-27 ENCOUNTER — Other Ambulatory Visit: Payer: Self-pay | Admitting: Internal Medicine

## 2023-12-27 DIAGNOSIS — Z1231 Encounter for screening mammogram for malignant neoplasm of breast: Secondary | ICD-10-CM

## 2024-01-14 ENCOUNTER — Ambulatory Visit: Payer: Federal, State, Local not specified - PPO | Admitting: Cardiovascular Disease

## 2024-01-21 ENCOUNTER — Ambulatory Visit
Admission: RE | Admit: 2024-01-21 | Discharge: 2024-01-21 | Disposition: A | Discharge status: Home / Self Care | Payer: Federal, State, Local not specified - PPO | Source: Ambulatory Visit

## 2024-01-21 DIAGNOSIS — Z1231 Encounter for screening mammogram for malignant neoplasm of breast: Secondary | ICD-10-CM

## 2024-01-24 ENCOUNTER — Other Ambulatory Visit: Payer: Self-pay | Admitting: Internal Medicine

## 2024-01-24 DIAGNOSIS — R928 Other abnormal and inconclusive findings on diagnostic imaging of breast: Secondary | ICD-10-CM

## 2024-01-28 ENCOUNTER — Ambulatory Visit: Payer: Federal, State, Local not specified - PPO | Admitting: Cardiovascular Disease

## 2024-02-05 ENCOUNTER — Ambulatory Visit: Payer: Federal, State, Local not specified - PPO

## 2024-02-05 ENCOUNTER — Ambulatory Visit
Admission: RE | Admit: 2024-02-05 | Discharge: 2024-02-05 | Disposition: A | Discharge status: Home / Self Care | Payer: Federal, State, Local not specified - PPO | Source: Ambulatory Visit | Attending: Internal Medicine | Admitting: Internal Medicine

## 2024-02-05 DIAGNOSIS — R928 Other abnormal and inconclusive findings on diagnostic imaging of breast: Secondary | ICD-10-CM

## 2024-02-22 ENCOUNTER — Encounter: Payer: Self-pay | Admitting: Cardiovascular Disease

## 2024-02-22 ENCOUNTER — Ambulatory Visit: Payer: Federal, State, Local not specified - PPO | Attending: Cardiovascular Disease | Admitting: Cardiovascular Disease

## 2024-02-22 VITALS — BP 135/65 | HR 58 | Ht 64.0 in | Wt 139.0 lb

## 2024-02-22 DIAGNOSIS — R002 Palpitations: Secondary | ICD-10-CM | POA: Diagnosis not present

## 2024-02-22 DIAGNOSIS — R931 Abnormal findings on diagnostic imaging of heart and coronary circulation: Secondary | ICD-10-CM | POA: Diagnosis not present

## 2024-02-22 DIAGNOSIS — E7800 Pure hypercholesterolemia, unspecified: Secondary | ICD-10-CM | POA: Diagnosis not present

## 2024-02-22 NOTE — Assessment & Plan Note (Signed)
 Palpitations related to PACs which have improved.  She was seeing Dr. Lalla Brothers.  Her last monitor performed 02/06/2023 showed improvement in her PAC frequency.  She is currently asymptomatic.

## 2024-02-22 NOTE — Assessment & Plan Note (Signed)
 Coronary calcium score of 36 performed 02/28/2022.  She is totally asymptomatic.  Based on this, we have have been more aggressive with risk factor modification including cholesterol management.

## 2024-02-22 NOTE — Patient Instructions (Signed)

## 2024-02-22 NOTE — Assessment & Plan Note (Signed)
 History of hyperlipidemia recently started on low-dose rosuvastatin with lipid profile performed 09/19/2023 revealing total cholesterol 232, LDL 147 and HDL of 60.  This was started by her PCP.  Given her mildly elevated coronary calcium score, her LDL goal should be less than 70.

## 2024-02-22 NOTE — Progress Notes (Signed)
 02/22/2024 Jean Jennings   [birth year]  [id number]  Primary Physician Alysia Penna, MD Primary Cardiologist: Runell Gess MD Nicholes Calamity, [place]  HPI:  Jean Jennings is a 83 y.o.    thin and fit appearing married Caucasian female mother of 3, grandmother 7 grandchildren who is retired from working in [employer].  She was referred by Dr. Link Snuffer for evaluation of symptomatic palpitations.  I last saw her in the office 07/14/22 she has a positive family history for heart disease.  She has had palpitations for decades worse in the last 2 to 3 months after having 2 steroid injections in her foot for symptomatic plantar fasciitis by Dr. Ardelle Anton.  She had a recent event monitor that showed sinus rhythm/sinus bradycardia with occasional PVCs and PACs and a normal 2D echo.    She has developed myasthenia gravis in April of this year.  She was taken off of propranolol at that time and since then her palpitations have become more severe.  She was hospitalized at [facility] back in July.  2D echo was essentially normal as was a chest CT.  I did get a event monitor on 07/22/2021 that showed frequent PACs (2.2% burden) with runs of SVT.  I referred her to Dr. Steffanie Dunn who did not feel she was necessarily a candidate for antiarrhythmic therapy or ablation given her minimal symptoms and the fact that she had moderate myasthenia gravis.  He thought that the medications could potentially interfere with that.  She had a lipid profile performed 08/29/2021 revealing total cholesterol 249, LDL 147 and HDL of 83.  Since I saw her in the office a year and a half ago she continues to do well.  Her palpitations have improved as has her myasthenia.  She did see Dr. Lalla Brothers for her PACs and follow-up event monitor performed 01/17/2023 showed this had significantly improved.  She did have a coronary calcium score performed 02/28/2022 which was 36.  We been focusing on risk factor modification  including lipid management.  She lives at [facility] in is fairly active doing water fitness yoga and other activities.     Current Meds  Medication Sig   calcium acetate (PHOSLO) 667 MG tablet Take 667 mg by mouth 3 (three) times daily.   Cholecalciferol (VITAMIN D) 2000 UNITS CAPS Take 1 capsule by mouth every evening.    citalopram (CELEXA) 20 MG tablet Take 1 tablet by mouth daily.   Cyanocobalamin (VITAMIN B12 PO) every other day. UNSURE OF DOSAGE   levothyroxine (SYNTHROID) 25 MCG tablet Take 25 mcg by mouth daily before breakfast.   levothyroxine (SYNTHROID) 88 MCG tablet Take 125 mcg by mouth daily.   pantoprazole (PROTONIX) 20 MG tablet Take 20 mg by mouth every morning.   Probiotic Product (PROBIOTIC DAILY PO) Take 1 capsule by mouth daily.   rosuvastatin (CRESTOR) 5 MG tablet SMARTSIG:1 Tablet(s) By Mouth Every Evening   tretinoin (RETIN-A) 0.1 % cream as needed.     Allergies  Allergen Reactions   Codeine Nausea And Vomiting    Lightheaded also    Social History   Socioeconomic History   Marital status: Married    Spouse name: Not on file   Number of children: Not on file   Years of education: Not on file   Highest education level: Not on file  Occupational History   Not on file  Tobacco Use   Smoking status: Former  Current packs/day: 0.00    Average packs/day: 0.3 packs/day for 3.0 years (0.8 ttl pk-yrs)    Types: Cigarettes    Start date: 12/11/1965    Quit date: 12/11/1968    Years since quitting: 55.2   Smokeless tobacco: Never  Vaping Use   Vaping status: Never Used  Substance and Sexual Activity   Alcohol use: Yes    Comment: occasional   Drug use: No   Sexual activity: Not on file  Other Topics Concern   Not on file  Social History Narrative   Not on file   Social Drivers of Health   Financial Resource Strain: Low Risk  (03/27/2023)   Received from [facility], [facility]   Overall Financial Resource Strain (CARDIA)    Difficulty  of Paying Living Expenses: Not hard at all  Food Insecurity: No Food Insecurity (03/27/2023)   Received from [facility], [facility]   Hunger Vital Sign    Worried About Running Out of Food in the Last Year: Never true    Ran Out of Food in the Last Year: Never true  Transportation Needs: No Transportation Needs (03/27/2023)   Received from [facility], [facility]   PRAPARE - Transportation    Lack of Transportation (Medical): No    Lack of Transportation (Non-Medical): No  Physical Activity: Insufficiently Active (03/27/2023)   [facility], [facility]   Exercise Vital Sign    Days of Exercise per Week: 3 days    Minutes of Exercise per Session: 20 min  Stress: Patient Declined (03/27/2023)   Received from [facility], [facility] - Occupational Stress Questionnaire    Feeling of Stress : Patient declined  Social Connections: Socially Integrated (03/27/2023)   Received from [facility], [facility]   Social Network    How would you rate your social network (family, work, friends)?: Good participation with social networks  Intimate Partner Violence: Not At Risk (03/27/2023)   Received from [facility], [facility]   HITS    Over the last 12 months how often did your partner physically hurt you?: Never    Over the last 12 months how often did your partner insult you or talk down to you?: Never    Over the last 12 months how often did your partner threaten you with physical harm?: Never    Over the last 12 months how often did your partner scream or curse at you?: Never     Review of Systems: General: negative for chills, fever, night sweats or weight changes.  Cardiovascular: negative for chest pain, dyspnea on exertion, edema, orthopnea, palpitations, paroxysmal nocturnal dyspnea or shortness of breath Dermatological: negative for rash Respiratory: negative for cough or wheezing Urologic: negative for  hematuria Abdominal: negative for nausea, vomiting, diarrhea, bright red blood per rectum, melena, or hematemesis Neurologic: negative for visual changes, syncope, or dizziness All other systems reviewed and are otherwise negative except as noted above.    Blood pressure 135/65, pulse (!) 58, height 5\' 4"  (1.626 m), weight 139 lb (63 kg), SpO2 96%.  General appearance: alert and no distress Neck: no adenopathy, no carotid bruit, no JVD, supple, symmetrical, trachea midline, and thyroid not enlarged, symmetric, no tenderness/mass/nodules Lungs: clear to auscultation bilaterally Heart: regular rate and rhythm, S1, S2 normal, no murmur, click, rub or gallop Extremities: extremities normal, atraumatic, no cyanosis or edema Pulses: 2+ and symmetric Skin: Skin color, texture, turgor normal. No rashes  or lesions Neurologic: Grossly normal  EKG EKG Interpretation Date/Time:  Friday February 22 2024 11:42:25 EDT Ventricular Rate:  58 PR Interval:  122 QRS Duration:  88 QT Interval:  444 QTC Calculation: 435 R Axis:   35  Text Interpretation: Sinus bradycardia When compared with ECG of 15-Jul-2021 11:45, Vent. rate has decreased BY  28 BPM T wave amplitude has increased in Lateral leads Confirmed by Nanetta Batty [phone]) on 02/22/2024 12:03:02 PM    ASSESSMENT AND PLAN:   Hypercholesterolemia History of hyperlipidemia recently started on low-dose rosuvastatin with lipid profile performed 09/19/2023 revealing total cholesterol 232, LDL 147 and HDL of 60.  This was started by her PCP.  Given her mildly elevated coronary calcium score, her LDL goal should be less than 70.  Palpitations Palpitations related to PACs which have improved.  She was seeing Dr. Lalla Brothers.  Her last monitor performed 02/06/2023 showed improvement in her PAC frequency.  She is currently asymptomatic.  Elevated coronary artery calcium score Coronary calcium score of 36 performed 02/28/2022.  She is totally asymptomatic.   Based on this, we have have been more aggressive with risk factor modification including cholesterol management.     Runell Gess MD FACP,FACC,FAHA, [facility] 02/22/2024 12:11 PM

## 2024-09-22 NOTE — Progress Notes (Unsigned)
  Electrophysiology Office Follow up Visit Note:    Date:  09/23/2024   ID:  Jean Jennings, DOB 1941/10/26, MRN [id number]  PCP:  Larnell Hamilton, MD  [facility] Cardiologist:  Dorn Lesches, MD  [facility] Electrophysiologist:  OLE ONEIDA HOLTS, MD    Interval History:     Jean Jennings is a 83 y.o. female who presents for a follow up visit.    I last saw the patient February 06, 2023 for palpitations.  She also has a history of myasthenia gravis. She is doing well.  No complaints.  She reports rare palpitations, mostly at night.      Past medical, surgical, social and family history were reviewed.  ROS:   Please see the history of present illness.    All other systems reviewed and are negative.  EKGs/Labs/Other Studies Reviewed:    The following studies were reviewed today:  March 03, 2023 ZIO monitor reviewed.  Rare ectopy.        Physical Exam:    VS:  BP (!) 142/70   Pulse 74   Ht 5' 4 (1.626 m)   Wt 143 lb (64.9 kg)   SpO2 97%   BMI 24.55 kg/m     Wt Readings from Last 3 Encounters:  09/23/24 143 lb (64.9 kg)  02/22/24 139 lb (63 kg)  12/06/23 143 lb 12.8 oz (65.2 kg)     GEN: no distress CARD: RRR, No MRG RESP: No IWOB. CTAB.      ASSESSMENT:    1. PAC (premature atrial contraction)   2. Palpitations    PLAN:    In order of problems listed above:  #Palpitations I suspect her palpitations are secondary to rare PACs and PVCs.  I am going to avoid treating these for now given the risk of off target effects with medical therapy.  #Myasthenia gravis Follows with [facility].  Follow-up as needed with EP moving forward.  I did discuss my upcoming departure from [facility].  If she were to have an issue moving forward, she can follow-up with Dr. Almetta.   Signed, OLE HOLTS, MD, [facility], [facility] 09/23/2024 2:38 PM    Electrophysiology [facility]

## 2024-09-23 ENCOUNTER — Ambulatory Visit: Attending: Cardiology | Admitting: Cardiology

## 2024-09-23 VITALS — BP 142/70 | HR 74 | Ht 64.0 in | Wt 143.0 lb

## 2024-09-23 DIAGNOSIS — R002 Palpitations: Secondary | ICD-10-CM

## 2024-09-23 DIAGNOSIS — I491 Atrial premature depolarization: Secondary | ICD-10-CM | POA: Diagnosis not present

## 2024-09-23 NOTE — Patient Instructions (Signed)
 Medication Instructions:  Your physician recommends that you continue on your current medications as directed. Please refer to the Current Medication list given to you today.  *If you need a refill on your cardiac medications before your next appointment, please call your pharmacy*  Follow-Up: At [facility], you and your health needs are our priority.  As part of our continuing mission to provide you with exceptional heart care, our providers are all part of one team.  This team includes your primary Cardiologist (physician) and Advanced Practice Providers or APPs (Physician Assistants and Nurse Practitioners) who all work together to provide you with the care you need, when you need it.  Your next appointment:   As needed with Dr. Almetta

## 2024-10-21 ENCOUNTER — Telehealth (HOSPITAL_BASED_OUTPATIENT_CLINIC_OR_DEPARTMENT_OTHER): Payer: Self-pay

## 2024-10-21 NOTE — Telephone Encounter (Signed)
   Pre-operative Risk Assessment    Patient Name: Jean Jennings  DOB: 09/14/1941 MRN: [id number]   Date of last office visit: 09/23/2024 with Dr. Cindie Date of next office visit: N/A  Request for Surgical Clearance    Procedure:  Left total knee arthroplasty  Date of Surgery:  Clearance 01/12/25                                 Surgeon:  Dr. Dempsey No Surgeon's Group or Practice Name:  [facility] Phone number:  [phone] Fax number:  [phone]   Type of Clearance Requested:   - Medical    Type of Anesthesia:  Choice   Additional requests/questions:  N/A  SignedPatrcia Iverson CROME   10/21/2024, 4:12 PM

## 2024-10-22 NOTE — Telephone Encounter (Signed)
 Patient has been scheduled for IN OFFICE VISIT

## 2024-10-22 NOTE — Telephone Encounter (Signed)
   Name: Jean Jennings  DOB: 18-Apr-1941  MRN: [id number]  Primary Cardiologist: Dorn Lesches, MD  Chart reviewed as part of pre-operative protocol coverage. Because of Lynore Coscia Ambrosio's past medical history and time since last visit, she will require a follow-up in-office visit in order to better assess preoperative cardiovascular risk. The patient will be due for general cardiology follow-up in March 2026. Given age, history, prior mild AI on echo in 2020, probably best to have in-person evaluation to satisfy her yearly general cardiology visit prior to surgical date. Can we please schedule her an appointment with our general cardiology team in late December/early January to allow time for updated testing if indicated? Thank you!  Pre-op covering staff: - Please schedule appointment and call patient to inform them. If patient already had an upcoming appointment within acceptable timeframe, please add pre-op clearance to the appointment notes so provider is aware. - Please contact requesting surgeon's office via preferred method (i.e, phone, fax) to inform them of need for appointment prior to surgery.  No meds listed to hold.  Elenora Hawbaker N Jerrilyn Messinger, PA-C  10/22/2024, 1:41 PM

## 2024-12-22 NOTE — H&P (Addendum)
 TOTAL KNEE ADMISSION H&P  Patient is being admitted for left total knee arthroplasty.  Subjective:  Chief Complaint: Left knee pain.  HPI: Jean Jennings, 84 y.o. female has a history of pain and functional disability in the left knee due to arthritis and has failed non-surgical conservative treatments for greater than 12 weeks to include corticosteriod injections, viscosupplementation injections, and activity modification. Onset of symptoms was gradual, starting >10 years ago with gradually worsening course since that time. The patient noted no past surgery on the left knee.  Patient currently rates pain in the left knee at 8 out of 10 with activity. Patient has night pain, worsening of pain with activity and weight bearing, pain with passive range of motion, and crepitus. Patient has evidence of bone-on-bone changes in the medial and patellofemoral compartments of the left knee by imaging studies. There is no active infection.  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    Past Medical History:  Diagnosis Date   Anemia    as a younger woman   Anxiety    Arthritis    Diarrhea last 3 to 4 months   stomach pain   Dysrhythmia 3-4 yrs ago   hx irregular heart beat, dr tammy manages   GERD (gastroesophageal reflux disease)    Hypothyroidism    IBS (irritable bowel syndrome)    Myasthenia gravis (HCC)    PONV (postoperative nausea and vomiting)     a little bit    Past Surgical History:  Procedure Laterality Date   ABDOMINAL HYSTERECTOMY  1970's    arthroscopic surgery  oct 2012   left knee   COLONOSCOPY WITH PROPOFOL  N/A 06/03/2013   Procedure: COLONOSCOPY WITH PROPOFOL ;  Surgeon: Gladis MARLA Louder, MD;  Location: [facility];  Service: Endoscopy;  Laterality: N/A;   left wriist ligament surgery  sev yrs ago   LUMBAR LAMINECTOMY/DECOMPRESSION MICRODISCECTOMY Left 06/09/2015   Procedure: Left Lumbar two-three Microdiskectomy;  Surgeon: Reyes Budge, MD;  Location: [facility];  Service: Neurosurgery;  Laterality: Left;  Left L2-3 Microdiskectomy   ORIF ANKLE FRACTURE Right 12/06/2023   Procedure: OPEN REDUCTION INTERNAL FIXATION (ORIF)  PILON ANKLE FRACTURE WITHOUT INTERNAL FIXATION OF FIBULA;  Surgeon: Barton Drape, MD;  Location: [facility];  Service: Orthopedics;  Laterality: Right;   thryoid surgery  1963   partial   TONSILLECTOMY      Prior to Admission medications  Medication Sig Start Date End Date Taking? Authorizing Provider  calcium acetate (PHOSLO) 667 MG tablet Take 667 mg by mouth 3 (three) times daily. Patient not taking: Reported on 09/23/2024 06/19/21   [provider]  Cholecalciferol (VITAMIN D) 2000 UNITS CAPS Take 1 capsule by mouth every evening.     [provider]  citalopram  (CELEXA ) 20 MG tablet Take 1 tablet by mouth daily. 06/20/21   [provider]  Cyanocobalamin (VITAMIN B12 PO) every other day. UNSURE OF DOSAGE    [provider]  levothyroxine  (SYNTHROID ) 25 MCG tablet Take 25 mcg by mouth daily before breakfast.    [provider]  levothyroxine  (SYNTHROID ) 88 MCG tablet Take 125 mcg by mouth daily. 05/28/22   [provider]  pantoprazole (PROTONIX) 20 MG tablet Take 20 mg by mouth every morning.    [provider]  predniSONE (DELTASONE) 5 MG tablet Take 5 mg by mouth daily. Patient not taking: Reported on 09/23/2024    [provider]  Probiotic Product (PROBIOTIC DAILY PO) Take 1 capsule by mouth daily.     [provider]  rosuvastatin (CRESTOR) 5 MG tablet SMARTSIG:1 Tablet(s) By Mouth Every Evening    [provider]  tretinoin (RETIN-A) 0.1 % cream as needed. Patient not taking: Reported on 09/23/2024 12/26/18   [provider]    Allergies[1]  Social History   Socioeconomic History   Marital status: Married    Spouse name: Not on file   Number of children: Not on file   Years of education: Not on file   Highest education level: Not on file  Occupational History   Not on file  Tobacco Use   Smoking status: Former    Current packs/day: 0.00    Average packs/day: 0.3 packs/day for 3.0 years (0.8 ttl pk-yrs)    Types: Cigarettes    Start date: 12/11/1965    Quit date: 12/11/1968    Years since quitting: 56.0   Smokeless tobacco: Never  Vaping Use   Vaping status: Never Used  Substance and Sexual Activity   Alcohol use: Yes    Comment: occasional   Drug use: No   Sexual activity: Not on file  Other Topics Concern   Not on file  Social History Narrative   Not on file   Social Drivers of Health   Tobacco Use: Medium Risk (05/09/2024)   Received from [facility]   Patient History    Smoking Tobacco Use: Former    Smokeless Tobacco Use: Never    Passive Exposure: Not on file  Financial Resource Strain: Low Risk (03/27/2023)   Received from [facility]   Overall Financial Resource Strain (CARDIA)    Difficulty of Paying Living Expenses: Not hard at all  Food Insecurity: No Food Insecurity (03/27/2023)   Received from [facility]   Epic    Within the past 12 months, you worried that your food would run out before you got the money to buy more.: Never true    Within the past 12 months, the food you bought just didn't last and you didn't have money to get more.: Never true  Transportation Needs: No Transportation Needs (03/27/2023)   Received from [facility] - Transportation    Lack of Transportation (Medical): No    Lack of  Transportation (Non-Medical): No  Physical Activity: Insufficiently Active (03/27/2023)   Received from [facility]   Exercise Vital Sign    On average, how many days per week do you engage in moderate to strenuous exercise (like a brisk walk)?: 3 days    On average, how many minutes do you engage in exercise at this level?: 20 min  Stress: Patient Declined (03/27/2023)   Received from [facility] - Occupational Stress Questionnaire    Feeling of Stress : Patient declined  Social Connections: Socially Integrated (03/27/2023)   Received from [facility]   Social Network    How would you rate your social network (family, work, friends)?: Good participation with social networks  Intimate Partner Violence: Not At Risk (03/27/2023)   Received  from [facility]   HITS    Over the last 12 months how often did your partner physically hurt you?: Never    Over the last 12 months how often did your partner insult you or talk down to you?: Never    Over the last 12 months how often did your partner threaten you with physical harm?: Never    Over the last 12 months how often did your partner scream or curse at you?: Never  Depression (PHQ2-9): Not on file  Alcohol Screen: Not on file  Housing: Not on file  Utilities: Not At Risk (03/27/2023)   Received from [facility] Utilities    Threatened with loss of utilities: No  Health Literacy: Not on file    Tobacco Use: Medium Risk (05/09/2024)   Received from [facility]   Patient History    Smoking Tobacco Use: Former    Smokeless Tobacco Use: Never    Passive Exposure: Not on file   Social History   Substance and Sexual Activity  Alcohol Use Yes   Comment: occasional    Family History  Problem Relation Age of Onset   Liver cancer Mother    Colon cancer Mother    Bone cancer Father     Review of Systems  Constitutional:  Negative for chills and fever.  HENT:  Negative for  congestion, sore throat and tinnitus.   Eyes:  Negative for double vision, photophobia and pain.  Respiratory:  Negative for cough, shortness of breath and wheezing.   Cardiovascular:  Negative for chest pain, palpitations and orthopnea.  Gastrointestinal:  Negative for heartburn, nausea and vomiting.  Genitourinary:  Negative for dysuria, frequency and urgency.  Musculoskeletal:  Positive for joint pain.  Neurological:  Negative for dizziness, weakness and headaches.    Objective:  Physical Exam: Well nourished and well developed.  General: Alert and oriented x3, cooperative and pleasant, no acute distress.  Head: normocephalic, atraumatic, neck supple.  Eyes: EOMI.  Musculoskeletal:  Left knee: Slight varus. Range of motion is 0 to 130 degrees. Crepitus noted on range of motion. Tender medially and laterally. No instability  Calves soft and nontender. Motor function intact in LE. Strength 5/5 LE bilaterally. Neuro: Distal pulses 2+. Sensation to light touch intact in LE.  Imaging Review Plain radiographs demonstrate severe degenerative joint disease of the left knee. The overall alignment is mild varus. The bone quality appears to be adequate for age and reported activity level.  Assessment/Plan:  End stage arthritis, left knee   The patient history, physical examination, clinical judgment of the provider and imaging studies are consistent with end stage degenerative joint disease of the left knee and total knee arthroplasty is deemed medically necessary. The treatment options including medical management, injection therapy arthroscopy and arthroplasty were discussed at length. The risks and benefits of total knee arthroplasty were presented and reviewed. The risks due to aseptic loosening, infection, stiffness, patella tracking problems, thromboembolic complications and other imponderables were discussed. The patient acknowledged the explanation, agreed to proceed with the plan and  consent was signed. Patient is being admitted for inpatient treatment for surgery, pain control, PT, OT, prophylactic antibiotics, VTE prophylaxis, progressive ambulation and ADLs and discharge planning. The patient is planning to be discharged home.   Patient's anticipated LOS is less than 2 midnights, meeting these requirements: - Lives within 1 hour of care - Has a competent adult at home to recover with post-op recover - NO history of  -  Chronic pain requiring opioids  - Diabetes  - Coronary Artery Disease  - Heart failure  - Heart attack  - Stroke  - DVT/VTE  - Cardiac arrhythmia  - Respiratory Failure/COPD  - Renal failure  - Anemia  - Advanced Liver disease  Therapy Plans: [facility] Disposition: Independent Living vs SNF Planned DVT Prophylaxis: Aspirin 81 mg BID DME Needed: Vannie PCP: Glendia Freeman, MD (clearance received) Cardiologist: Dorn Lesches, MD (clearance received) Neurologist: Shanda Gloss, MD TXA: IV Allergies: Adhesive tape (rash), codeine (has tolerated oxycodone ) Metal Allergy: NKDA Anesthesia Concerns: None BMI: 25 Last HgbA1c: Not diabetic Pain Regimen: Oxycodone  (will need home script zofran  w/ this) Pharmacy: [facility] (if goes back home)  Other: - Patient with asymptomatic myasthenia gravis, which does not require treatment. PCP wanted this noted for anesthesia.  - Patient will let Dr. Melodi know the morning of surgery if she wants to go to rehab portion of [place] or if she would prefer to return home there - ** ice machine in OR ** - ** NO AQUACEL **  - Patient was instructed on what medications to stop prior to surgery. - Follow-up visit in 2 weeks with Dr. Melodi - Begin physical therapy following surgery - Pre-operative lab work as pre-surgical testing - Prescriptions will be provided in hospital at time of discharge  Roxie Mess, PA-C Orthopedic Surgery [facility] [place]       [1]   Allergies Allergen Reactions   Codeine Nausea And Vomiting    Lightheaded also

## 2024-12-24 ENCOUNTER — Encounter: Payer: Self-pay | Admitting: Cardiovascular Disease

## 2024-12-24 ENCOUNTER — Ambulatory Visit: Admitting: Cardiovascular Disease

## 2024-12-24 VITALS — BP 136/74 | HR 70 | Ht 64.0 in | Wt 142.4 lb

## 2024-12-24 DIAGNOSIS — Z01818 Encounter for other preprocedural examination: Secondary | ICD-10-CM | POA: Diagnosis not present

## 2024-12-24 DIAGNOSIS — E7800 Pure hypercholesterolemia, unspecified: Secondary | ICD-10-CM | POA: Diagnosis not present

## 2024-12-24 DIAGNOSIS — R931 Abnormal findings on diagnostic imaging of heart and coronary circulation: Secondary | ICD-10-CM | POA: Diagnosis not present

## 2024-12-24 DIAGNOSIS — R002 Palpitations: Secondary | ICD-10-CM

## 2024-12-24 DIAGNOSIS — I491 Atrial premature depolarization: Secondary | ICD-10-CM | POA: Diagnosis not present

## 2024-12-24 NOTE — Progress Notes (Signed)
 "     12/24/2024 Jean Jennings   1941/03/01  [id number]  Primary Physician Larnell Hamilton, MD Primary Cardiologist: Dorn JINNY Lesches MD [facility] [place] [place]  HPI:  Jean Jennings is a 84 y.o.   thin and fit appearing married Caucasian female mother of 3, grandmother 7 grandchildren who is retired from working in [employer].  She was referred by Dr. Larnell for evaluation of symptomatic palpitations.  I last saw her in the office 02/22/2024.  She has a positive family history for heart disease.  She has had palpitations for decades worse in the last 2 to 3 months after having 2 steroid injections in her foot for symptomatic plantar fasciitis by Dr. Gershon.  She had a recent event monitor that showed sinus rhythm/sinus bradycardia with occasional PVCs and PACs and a normal 2D echo.    She has developed myasthenia gravis in April of this year.  She was taken off of propranolol at that time and since then her palpitations have become more severe.  She was hospitalized at [facility] back in July.  2D echo was essentially normal as was a chest CT.  I did get a event monitor on 07/22/2021 that showed frequent PACs (2.2% burden) with runs of SVT.  I referred her to Dr. Ole Holts who did not feel she was necessarily a candidate for antiarrhythmic therapy or ablation given her minimal symptoms and the fact that she had moderate myasthenia gravis.  He thought that the medications could potentially interfere with that.  She had a lipid profile performed 08/29/2021 revealing total cholesterol 249, LDL 147 and HDL of 83.  She had a coronary calcium score performed 02/28/2022 which was 36.  We been focusing on risk factor modification including lipid management.  She lives at [facility] in is fairly active doing water  fitness yoga and other activities.  Since I saw her in March of last year she has remained stable.  Her myasthenia is not recurred.  Palpitations are markedly improved.   She apparently needs a left total knee replacement by Dr. Ivonne scheduled on 01/12/2025.   Active Medications[1]   Allergies[2]  Social History   Socioeconomic History   Marital status: Married    Spouse name: Not on file   Number of children: Not on file   Years of education: Not on file   Highest education level: Not on file  Occupational History   Not on file  Tobacco Use   Smoking status: Former    Current packs/day: 0.00    Average packs/day: 0.3 packs/day for 3.0 years (0.8 ttl pk-yrs)    Types: Cigarettes    Start date: 12/11/1965    Quit date: 12/11/1968    Years since quitting: 56.0   Smokeless tobacco: Never  Vaping Use   Vaping status: Never Used  Substance and Sexual Activity   Alcohol use: Yes    Comment: occasional   Drug use: No   Sexual activity: Not on file  Other Topics Concern   Not on file  Social History Narrative   Not on file   Social Drivers of Health   Tobacco Use: Medium Risk (12/24/2024)   Patient History    Smoking Tobacco Use: Former    Smokeless Tobacco Use: Never    Passive Exposure: Not on file  Financial Resource Strain: Low Risk (03/27/2023)   Received from [facility]   Overall Financial Resource Strain (CARDIA)    Difficulty of Paying Living  Expenses: Not hard at all  Food Insecurity: No Food Insecurity (03/27/2023)   Received from [facility]   Epic    Within the past 12 months, you worried that your food would run out before you got the money to buy more.: Never true    Within the past 12 months, the food you bought just didn't last and you didn't have money to get more.: Never true  Transportation Needs: No Transportation Needs (03/27/2023)   Received from [facility]   PRAPARE - Transportation    Lack of Transportation (Medical): No    Lack of Transportation (Non-Medical): No  Physical Activity: Insufficiently Active (03/27/2023)   Received from [facility]   Exercise Vital Sign    On average, how many days per week  do you engage in moderate to strenuous exercise (like a brisk walk)?: 3 days    On average, how many minutes do you engage in exercise at this level?: 20 min  Stress: Patient Declined (03/27/2023)   Received from [facility] of Occupational Health - Occupational Stress Questionnaire    Feeling of Stress : Patient declined  Social Connections: Socially Integrated (03/27/2023)   [facility]   Social Network    How would you rate your social network (family, work, friends)?: Good participation with social networks  Intimate Partner Violence: Not At Risk (03/27/2023)   Received from [facility]   HITS    Over the last 12 months how often did your partner physically hurt you?: Never    Over the last 12 months how often did your partner insult you or talk down to you?: Never    Over the last 12 months how often did your partner threaten you with physical harm?: Never    Over the last 12 months how often did your partner scream or curse at you?: Never  Depression (PHQ2-9): Not on file  Alcohol Screen: Not on file  Housing: Not on file  Utilities: Not At Risk (03/27/2023)   Received from [facility] Utilities    Threatened with loss of utilities: No  Health Literacy: Not on file     Review of Systems: General: negative for chills, fever, night sweats or weight changes.  Cardiovascular: negative for chest pain, dyspnea on exertion, edema, orthopnea, palpitations, paroxysmal nocturnal dyspnea or shortness of breath Dermatological: negative for rash Respiratory: negative for cough or wheezing Urologic: negative for hematuria Abdominal: negative for nausea, vomiting, diarrhea, bright red blood per rectum, melena, or hematemesis Neurologic: negative for visual changes, syncope, or dizziness All other systems reviewed and are otherwise negative except as noted above.    Blood pressure 136/74, pulse 70, height 5' 4 (1.626 m), weight 142 lb 6.4 oz  (64.6 kg), SpO2 96%.  General appearance: alert and no distress Neck: no adenopathy, no carotid bruit, no JVD, supple, symmetrical, trachea midline, and thyroid  not enlarged, symmetric, no tenderness/mass/nodules Lungs: clear to auscultation bilaterally Heart: regular rate and rhythm, S1, S2 normal, no murmur, click, rub or gallop Extremities: extremities normal, atraumatic, no cyanosis or edema Pulses: 2+ and symmetric Skin: Skin color, texture, turgor normal. No rashes or lesions Neurologic: Grossly normal  EKG EKG Interpretation Date/Time:  Wednesday December 24 2024 10:28:56 EST Ventricular Rate:  70 PR Interval:  130 QRS Duration:  74 QT Interval:  408 QTC Calculation: 440 R Axis:   33  Text Interpretation: Normal sinus rhythm Normal ECG When compared with ECG of 22-Feb-2024 11:42,  No significant change was found Confirmed by Court Carrier [phone]) on 12/24/2024 10:39:11 AM    ASSESSMENT AND PLAN:   Hypercholesterolemia History of hyperlipidemia on low-dose rosuvastatin with lipid profile performed/11/25 revealing total cholesterol 159, LDL 76 and HDL 66, at goal for secondary prevention.  Palpitations History of palpitations with event monitoring showing PVCs not on pharmacologic therapy.  She had seen Dr. Cindie in the past.  Since I saw her close a year ago her palpitations have almost completely resolved.  She occasionally gets them at night when she lies down.  Elevated coronary artery calcium score Mildly elevated coronary calcium score of 36 performed 02/28/2022.  She is completely asymptomatic.  Preoperative clearance Patient apparently scheduled for left total knee replacement by Dr. Ivonne on 01/12/2025.  She is here for preoperative clearance.  Her coronary calcium score is 36.  She has a normal 2D echo performed in 2020.  She is completely asymptomatic.  This is a relatively low risk procedure.  She is cleared at low risk.     Carrier DOROTHA Court MD FACP,FACC,FAHA,  [facility] 12/24/2024 10:46 AM    [1]  Current Meds  Medication Sig   calcium acetate (PHOSLO) 667 MG tablet Take 667 mg by mouth 3 (three) times daily.   cetirizine (ZYRTEC) 10 MG tablet Take 10 mg by mouth as needed.   Cholecalciferol (VITAMIN D) 2000 UNITS CAPS Take 1 capsule by mouth every evening.    citalopram  (CELEXA ) 20 MG tablet Take 1 tablet by mouth daily.   Cyanocobalamin (VITAMIN B12 PO) every other day. UNSURE OF DOSAGE   fluticasone (FLONASE) 50 MCG/ACT nasal spray as needed.   levothyroxine  (SYNTHROID ) 25 MCG tablet Take 25 mcg by mouth daily before breakfast.   levothyroxine  (SYNTHROID ) 88 MCG tablet Take 125 mcg by mouth daily.   pantoprazole (PROTONIX) 20 MG tablet Take 20 mg by mouth every morning.   predniSONE (DELTASONE) 5 MG tablet Take 5 mg by mouth daily.   Probiotic Product (PROBIOTIC DAILY PO) Take 1 capsule by mouth daily.   rosuvastatin (CRESTOR) 5 MG tablet SMARTSIG:1 Tablet(s) By Mouth Every Evening   tretinoin (RETIN-A) 0.1 % cream as needed.  [2]  Allergies Allergen Reactions   Codeine Nausea And Vomiting    Lightheaded also   "

## 2024-12-24 NOTE — Assessment & Plan Note (Signed)
 Mildly elevated coronary calcium score of 36 performed 02/28/2022.  She is completely asymptomatic.

## 2024-12-24 NOTE — Assessment & Plan Note (Signed)
 History of palpitations with event monitoring showing PVCs not on pharmacologic therapy.  She had seen Dr. Cindie in the past.  Since I saw her close a year ago her palpitations have almost completely resolved.  She occasionally gets them at night when she lies down.

## 2024-12-24 NOTE — Patient Instructions (Signed)

## 2024-12-24 NOTE — Assessment & Plan Note (Signed)
 Patient apparently scheduled for left total knee replacement by Dr. Ivonne on 01/12/2025.  She is here for preoperative clearance.  Her coronary calcium score is 36.  She has a normal 2D echo performed in 2020.  She is completely asymptomatic.  This is a relatively low risk procedure.  She is cleared at low risk.

## 2024-12-24 NOTE — Assessment & Plan Note (Signed)
 History of hyperlipidemia on low-dose rosuvastatin with lipid profile performed/11/25 revealing total cholesterol 159, LDL 76 and HDL 66, at goal for secondary prevention.

## 2024-12-25 NOTE — Patient Instructions (Addendum)
 SURGICAL WAITING ROOM VISITATION  Patients having surgery or a procedure may have no more than 2 support people in the waiting area - these visitors may rotate.    Children ages 51 and under will not be able to visit patients in [facility] under most circumstances.   Visitors with respiratory illnesses are discouraged from visiting and should remain at home.  If the patient needs to stay at the hospital during part of their recovery, the visitor guidelines for inpatient rooms apply. Pre-op nurse will coordinate an appropriate time for 1 support person to accompany patient in pre-op.  This support person may not rotate.    Please refer to the [facility] website for the visitor guidelines for Inpatients (after your surgery is over and you are in a regular room).       Your procedure is scheduled on: 01-12-25   Report to [facility] Main Entrance    Report to admitting at     0905  AM   Call this number if you have problems the morning of surgery [phone]   Do not eat food :After Midnight.   After Midnight you may have the following liquids until _0830_____ AM/ DAY OF SURGERY  then nothing by mouth  Water  Non-Citrus Juices (without pulp, NO RED-Apple, White grape, White cranberry) Black Coffee (NO MILK/CREAM OR CREAMERS, sugar ok)  Clear Tea (NO MILK/CREAM OR CREAMERS, sugar ok) regular and decaf                             Plain Jell-O (NO RED)                                           Fruit ices (not with fruit pulp, NO RED)                                     Popsicles (NO RED)                                                               Sports drinks like Gatorade (NO RED)                   The day of surgery:  Drink ONE (1) Pre-Surgery Clear Ensure BY     0830 AM the morning of surgery. Drink in one sitting. Do not sip.  This drink was given to you during your hospital  pre-op appointment visit. Nothing else to drink after completing the   Pre-Surgery Clear Ensure .          If you have questions, please contact your surgeons office.   FOLLOW  ANY ADDITIONAL PRE OP INSTRUCTIONS YOU RECEIVED FROM YOUR SURGEON'S OFFICE!!!     Oral Hygiene is also important to reduce your risk of infection.                                    Remember - BRUSH YOUR TEETH THE MORNING  OF SURGERY WITH YOUR REGULAR TOOTHPASTE  DENTURES WILL BE REMOVED PRIOR TO SURGERY PLEASE DO NOT APPLY Poly grip OR ADHESIVES!!!   Do NOT smoke after Midnight   Stop all vitamins and herbal supplements 7 days before surgery.   Take these medicines the morning of surgery with A SIP OF WATER : Pantoprazole, levothyroxine , citalopram (celexa )  DO NOT TAKE ANY ORAL DIABETIC MEDICATIONS DAY OF YOUR SURGERY  Bring CPAP mask and tubing day of surgery.                              You may not have any metal on your body including hair pins, jewelry, and body piercing             Do not wear make-up, lotions, powders, perfumes/cologne, or deodorant  Do not wear nail polish including gel and S&S, artificial/acrylic nails, or any other type of covering on natural nails including finger and toenails. If you have artificial nails, gel coating, etc. that needs to be removed by a nail salon please have this removed prior to surgery or surgery may need to be canceled/ delayed if the surgeon/ anesthesia feels like they are unable to be safely monitored.   Do not shave  4 days prior to surgery.         Do not bring valuables to the hospital. [facility] IS NOT             RESPONSIBLE   FOR VALUABLES.   Contacts, glasses, dentures or bridgework may not be worn into surgery.   Bring small overnight bag day of surgery.   DO NOT BRING YOUR HOME MEDICATIONS TO THE HOSPITAL. PHARMACY WILL DISPENSE MEDICATIONS LISTED ON YOUR MEDICATION LIST TO YOU DURING YOUR ADMISSION IN THE HOSPITAL!    Patients discharged on the day of surgery will not be allowed to drive home.  Someone  NEEDS to stay with you for the first 24 hours after anesthesia.   Special Instructions: Bring a copy of your healthcare power of attorney and living will documents the day of surgery if you haven't scanned them before.              Please read over the following fact sheets you were given: IF YOU HAVE QUESTIONS ABOUT YOUR PRE-OP INSTRUCTIONS PLEASE CALL [phone].    If you test positive for Covid or have been in contact with anyone that has tested positive in the last 10 days please notify you surgeon.      Pre-operative 4 CHG Bath Instructions  DYNA-Hex 4 Chlorhexidine  Gluconate 4% Solution Antiseptic 4 fl. oz   You can play a key role in reducing the risk of infection after surgery. Your skin needs to be as free of germs as possible. You can reduce the number of germs on your skin by washing with CHG (chlorhexidine  gluconate) soap before surgery. CHG is an antiseptic soap that kills germs and continues to kill germs even after washing.   DO NOT use if you have an allergy to chlorhexidine /CHG or antibacterial soaps. If your skin becomes reddened or irritated, stop using the CHG and notify one of our RNs at   Please shower with the CHG soap starting 4 days before surgery using the following schedule:     Please keep in mind the following:  DO NOT shave, including legs and underarms, starting the day of your first shower.   You may shave your face at  any point before/day of surgery.  Place clean sheets on your bed the day you start using CHG soap. Use a clean washcloth (not used since being washed) for each shower. DO NOT sleep with pets once you start using the CHG.  CHG Shower Instructions:  If you choose to wash your hair and private area, wash first with your normal shampoo/soap.  After you use shampoo/soap, rinse your hair and body thoroughly to remove shampoo/soap residue.  Turn the water  OFF and apply about 3 tablespoons (45 ml) of CHG soap to a CLEAN washcloth.  Apply CHG soap  ONLY FROM YOUR NECK DOWN TO YOUR TOES (washing for 3-5 minutes)  DO NOT use CHG soap on face, private areas, open wounds, or sores.  Pay special attention to the area where your surgery is being performed.  If you are having back surgery, having someone wash your back for you may be helpful. Wait 2 minutes after CHG soap is applied, then you may rinse off the CHG soap.  Pat dry with a clean towel  Put on clean clothes/pajamas   If you choose to wear lotion, please use ONLY the CHG-compatible lotions on the back of this paper.     Additional instructions for the day of surgery: DO NOT APPLY any lotions, deodorants, cologne, or perfumes.   Put on clean/comfortable clothes.  Brush your teeth.  Ask your nurse before applying any prescription medications to the skin.   CHG Compatible Lotions   Aveeno Moisturizing lotion  Cetaphil Moisturizing Cream  Cetaphil Moisturizing Lotion  Clairol Herbal Essence Moisturizing Lotion, Dry Skin  Clairol Herbal Essence Moisturizing Lotion, Extra Dry Skin  Clairol Herbal Essence Moisturizing Lotion, Normal Skin  Curel Age Defying Therapeutic Moisturizing Lotion with Alpha Hydroxy  Curel Extreme Care Body Lotion  Curel Soothing Hands Moisturizing Hand Lotion  Curel Therapeutic Moisturizing Cream, Fragrance-Free  Curel Therapeutic Moisturizing Lotion, Fragrance-Free  Curel Therapeutic Moisturizing Lotion, Original Formula  Eucerin Daily Replenishing Lotion  Eucerin Dry Skin Therapy Plus Alpha Hydroxy Crme  Eucerin Dry Skin Therapy Plus Alpha Hydroxy Lotion  Eucerin Original Crme  Eucerin Original Lotion  Eucerin Plus Crme Eucerin Plus Lotion  Eucerin TriLipid Replenishing Lotion  Keri Anti-Bacterial Hand Lotion  Keri Deep Conditioning Original Lotion Dry Skin Formula Softly Scented  Keri Deep Conditioning Original Lotion, Fragrance Free Sensitive Skin Formula  Keri Lotion Fast Absorbing Fragrance Free Sensitive Skin Formula  Keri Lotion Fast  Absorbing Softly Scented Dry Skin Formula  Keri Original Lotion  Keri Skin Renewal Lotion Keri Silky Smooth Lotion  Keri Silky Smooth Sensitive Skin Lotion  Nivea Body Creamy Conditioning Oil  Nivea Body Extra Enriched Lotion  Nivea Body Original Lotion  Nivea Body Sheer Moisturizing Lotion Nivea Crme  Nivea Skin Firming Lotion  NutraDerm 30 Skin Lotion  NutraDerm Skin Lotion  NutraDerm Therapeutic Skin Cream  NutraDerm Therapeutic Skin Lotion  ProShield Protective Hand Cream   Incentive Spirometer  An incentive spirometer is a tool that can help keep your lungs clear and active. This tool measures how well you are filling your lungs with each breath. Taking long deep breaths may help reverse or decrease the chance of developing breathing (pulmonary) problems (especially infection) following: A long period of time when you are unable to move or be active. BEFORE THE PROCEDURE  If the spirometer includes an indicator to show your best effort, your nurse or respiratory therapist will set it to a desired goal. If possible, sit up straight or lean slightly forward.  Try not to slouch. Hold the incentive spirometer in an upright position. INSTRUCTIONS FOR USE  Sit on the edge of your bed if possible, or sit up as far as you can in bed or on a chair. Hold the incentive spirometer in an upright position. Breathe out normally. Place the mouthpiece in your mouth and seal your lips tightly around it. Breathe in slowly and as deeply as possible, raising the piston or the ball toward the top of the column. Hold your breath for 3-5 seconds or for as long as possible. Allow the piston or ball to fall to the bottom of the column. Remove the mouthpiece from your mouth and breathe out normally. Rest for a few seconds and repeat Steps 1 through 7 at least 10 times every 1-2 hours when you are awake. Take your time and take a few normal breaths between deep breaths. The spirometer may include an  indicator to show your best effort. Use the indicator as a goal to work toward during each repetition. After each set of 10 deep breaths, practice coughing to be sure your lungs are clear. If you have an incision (the cut made at the time of surgery), support your incision when coughing by placing a pillow or rolled up towels firmly against it. Once you are able to get out of bed, walk around indoors and cough well. You may stop using the incentive spirometer when instructed by your caregiver.  RISKS AND COMPLICATIONS Take your time so you do not get dizzy or light-headed. If you are in pain, you may need to take or ask for pain medication before doing incentive spirometry. It is harder to take a deep breath if you are having pain. AFTER USE Rest and breathe slowly and easily. It can be helpful to keep track of a log of your progress. Your caregiver can provide you with a simple table to help with this. If you are using the spirometer at home, follow these instructions: SEEK MEDICAL CARE IF:  You are having difficultly using the spirometer. You have trouble using the spirometer as often as instructed. Your pain medication is not giving enough relief while using the spirometer. You develop fever of 100.5 F (38.1 C) or higher. SEEK IMMEDIATE MEDICAL CARE IF:  You cough up bloody sputum that had not been present before. You develop fever of 102 F (38.9 C) or greater. You develop worsening pain at or near the incision site. MAKE SURE YOU:  Understand these instructions. Will watch your condition. Will get help right away if you are not doing well or get worse. Document Released: 04/09/2007 Document Revised: 02/19/2012 Document Reviewed: 06/10/2007 [facility] Patient Information 2014 [place], [place].   ________________________________________________________________________

## 2024-12-25 NOTE — Progress Notes (Addendum)
 PCP - Larnell Hamilton, MD Cardiologist - Dorn Lesches, MD  clearance 12-24-24 epic Neuro -Shanda Gloss, MD    PPM/ICD -  Device Orders -  Rep Notified -   Chest x-ray -  EKG - 12-24-24 epic Stress Test -  ECHO - 2020 epic Cardiac Cath -  CT cardiac score -02-28-22 epic Long Term Monitor 07-22-21 epic  Sleep Study - n/a CPAP - n/a  Fasting Blood Sugar -  Checks Blood Sugar _n/a____ times a day  Blood Thinner Instructions:n/a Aspirin Instructions:n/a  ERAS Protcol - PRE-SURGERY Ensure    COVID vaccine -yes  Activity--Able to complete ADL's with no CP or SOB Anesthesia review: Myasthenia Gravis, PAC's  Patient denies shortness of breath, fever, cough and chest pain at PAT appointment   All instructions explained to the patient, with a verbal understanding of the material. Patient agrees to go over the instructions while at home for a better understanding. Patient also instructed to self quarantine after being tested for COVID-19. The opportunity to ask questions was provided.

## 2025-01-02 ENCOUNTER — Other Ambulatory Visit: Payer: Self-pay

## 2025-01-02 ENCOUNTER — Encounter (HOSPITAL_COMMUNITY): Payer: Self-pay

## 2025-01-02 ENCOUNTER — Encounter (HOSPITAL_COMMUNITY)
Admission: RE | Admit: 2025-01-02 | Discharge: 2025-01-02 | Disposition: A | Source: Ambulatory Visit | Attending: Orthopedic Surgery

## 2025-01-02 VITALS — BP 147/55 | HR 61 | Temp 98.0°F | Resp 16 | Ht 64.0 in | Wt 141.0 lb

## 2025-01-02 DIAGNOSIS — Z87891 Personal history of nicotine dependence: Secondary | ICD-10-CM | POA: Diagnosis not present

## 2025-01-02 DIAGNOSIS — K219 Gastro-esophageal reflux disease without esophagitis: Secondary | ICD-10-CM | POA: Insufficient documentation

## 2025-01-02 DIAGNOSIS — Z01818 Encounter for other preprocedural examination: Secondary | ICD-10-CM

## 2025-01-02 DIAGNOSIS — M1712 Unilateral primary osteoarthritis, left knee: Secondary | ICD-10-CM | POA: Diagnosis not present

## 2025-01-02 DIAGNOSIS — E039 Hypothyroidism, unspecified: Secondary | ICD-10-CM | POA: Insufficient documentation

## 2025-01-02 DIAGNOSIS — I491 Atrial premature depolarization: Secondary | ICD-10-CM | POA: Insufficient documentation

## 2025-01-02 DIAGNOSIS — G7000 Myasthenia gravis without (acute) exacerbation: Secondary | ICD-10-CM | POA: Diagnosis not present

## 2025-01-02 DIAGNOSIS — I2510 Atherosclerotic heart disease of native coronary artery without angina pectoris: Secondary | ICD-10-CM | POA: Diagnosis not present

## 2025-01-02 DIAGNOSIS — Z01812 Encounter for preprocedural laboratory examination: Secondary | ICD-10-CM | POA: Insufficient documentation

## 2025-01-02 DIAGNOSIS — F419 Anxiety disorder, unspecified: Secondary | ICD-10-CM | POA: Insufficient documentation

## 2025-01-02 DIAGNOSIS — Z79890 Hormone replacement therapy (postmenopausal): Secondary | ICD-10-CM | POA: Diagnosis not present

## 2025-01-02 LAB — CBC
HCT: 38.8 % (ref 36.0–46.0)
Hemoglobin: 12.1 g/dL (ref 12.0–15.0)
MCH: 31.1 pg (ref 26.0–34.0)
MCHC: 31.2 g/dL (ref 30.0–36.0)
MCV: 99.7 fL (ref 80.0–100.0)
Platelets: 188 K/uL (ref 150–400)
RBC: 3.89 MIL/uL (ref 3.87–5.11)
RDW: 12.3 % (ref 11.5–15.5)
WBC: 6.3 K/uL (ref 4.0–10.5)
nRBC: 0 % (ref 0.0–0.2)

## 2025-01-02 LAB — SURGICAL PCR SCREEN
MRSA, PCR: NEGATIVE
Staphylococcus aureus: POSITIVE — AB

## 2025-01-02 LAB — BASIC METABOLIC PANEL WITH GFR
Anion gap: 9 (ref 5–15)
BUN: 14 mg/dL (ref 8–23)
CO2: 25 mmol/L (ref 22–32)
Calcium: 9.9 mg/dL (ref 8.9–10.3)
Chloride: 106 mmol/L (ref 98–111)
Creatinine, Ser: 0.82 mg/dL (ref 0.44–1.00)
GFR, Estimated: >60 mL/min
Glucose, Bld: 96 mg/dL (ref 70–99)
Potassium: 4.1 mmol/L (ref 3.5–5.1)
Sodium: 141 mmol/L (ref 135–145)

## 2025-01-02 NOTE — Progress Notes (Signed)
 PCR + staph

## 2025-01-05 ENCOUNTER — Encounter (HOSPITAL_COMMUNITY): Payer: Self-pay

## 2025-01-05 NOTE — Progress Notes (Signed)
 " Case: [id number] Date/Time: 01/12/25 1120   Procedure: ARTHROPLASTY, KNEE, TOTAL (Left: Knee)   Anesthesia type: Choice   Pre-op diagnosis: Left knee osteoarthritis   Location[facility] ROOM 09 / [facility]   Surgeons: Melodi Lerner, MD       DISCUSSION: Jean Jennings is an 84 yo female with PMH of former smoking, CAD (by CT), palpitations/PACs, myasthenia gravis, GERD, hypothyroid, anxiety, PONV  Patient follows with Cardiology for frequent PACs and palpitations. Last seen by Dr. Court on 12/24/24. Palpitations noted to be improved. She was cleared for surgery:  Preoperative clearance Patient apparently scheduled for left total knee replacement by Dr. Ivonne on 01/12/2025.  She is here for preoperative clearance.  Her coronary calcium score is 36.  She has a normal 2D echo performed in 2020.  She is completely asymptomatic.  This is a relatively low risk procedure.  She is cleared at low risk.   Follows with [facility]rology at [facility]. Last seen on 05/14/24 by Dr. Davina. She was noted to be asymptomatic and is off therapy at this time. Advised f/u in 8 months.   VS: BP (!) 147/55   Pulse 61   Temp 36.7 C (Oral)   Resp 16   Ht 5' 4 (1.626 m)   Wt 64 kg   SpO2 100%   BMI 24.20 kg/m   PROVIDERS: Larnell Hamilton, MD   LABS: Labs reviewed: Acceptable for surgery. (all labs ordered are listed, but only abnormal results are displayed)  Labs Reviewed  SURGICAL PCR SCREEN - Abnormal; Notable for the following components:      Result Value   Staphylococcus aureus POSITIVE (*)    All other components within normal limits  BASIC METABOLIC PANEL WITH GFR  CBC     EKG 12/24/24:  NSR  Event monitor 03/02/2023:  HR 55 - 162 bpm, average 74 bpm. 3 nonsustained SVT, longest 12 beats. Rare supraventricular and ventricular ectopy. No sustained arrhythmias. No atrial fibrillation.  CT Calcium score 02/28/2022:  IMPRESSION: Coronary calcium score of 36.4. This was 24 th percentile for  age and sex matched control.    Past Medical History:  Diagnosis Date   Anemia    as a younger woman   Anxiety    Arthritis    Diarrhea last 3 to 4 months   stomach pain   Dysrhythmia 3-4 yrs ago   pac's   GERD (gastroesophageal reflux disease)    Hypothyroidism    IBS (irritable bowel syndrome)    Myasthenia gravis (HCC)    PONV (postoperative nausea and vomiting)     a little bit    Past Surgical History:  Procedure Laterality Date   ABDOMINAL HYSTERECTOMY  08/11/1969   arthroscopic surgery  09/11/2011   left knee   COLONOSCOPY WITH PROPOFOL  N/A 06/03/2013   Procedure: COLONOSCOPY WITH PROPOFOL ;  Surgeon: Gladis MARLA Louder, MD;  [facility]: [facility];  Service: Endoscopy;  Laterality: N/A;   cyst removed from hand Left    left wriist ligament surgery  sev yrs ago   LUMBAR LAMINECTOMY/DECOMPRESSION MICRODISCECTOMY Left 06/09/2015   Procedure: Left Lumbar two-three Microdiskectomy;  Surgeon: Reyes Budge, MD;  Location: [facility];  Service: Neurosurgery;  Laterality: Left;  Left L2-3 Microdiskectomy   ORIF ANKLE FRACTURE Right 12/06/2023   Procedure: OPEN REDUCTION INTERNAL FIXATION (ORIF)  PILON ANKLE FRACTURE WITHOUT INTERNAL FIXATION OF FIBULA;  Surgeon: Barton Drape, MD;  Location: [facility];  Service: Orthopedics;  Laterality: Right;   thryoid  surgery  12/11/1961   partial   TONSILLECTOMY      MEDICATIONS:  CALCIUM PO   Cholecalciferol (VITAMIN D) 2000 UNITS CAPS   citalopram  (CELEXA ) 20 MG tablet   Cyanocobalamin (VITAMIN B12 PO)   levothyroxine  (SYNTHROID ) 25 MCG tablet   levothyroxine  (SYNTHROID ) 88 MCG tablet   pantoprazole (PROTONIX) 20 MG tablet   rosuvastatin (CRESTOR) 5 MG tablet   No current facility-administered medications for this encounter.   Burnard CHRISTELLA Odis DEVONNA [facility] Short Stay/Anesthesiology [facility] Phone [phone] 01/05/2025 7:48 PM        "

## 2025-01-05 NOTE — Anesthesia Preprocedure Evaluation (Addendum)
"                                    Anesthesia Evaluation  Patient identified by MRN, date of birth, ID band Patient awake    Reviewed: Allergy & Precautions, NPO status , Patient's Chart, lab work & pertinent test results, reviewed documented beta blocker date and time   History of Anesthesia Complications (+) PONV and history of anesthetic complications  Airway Mallampati: II  TM Distance: >3 FB     Dental no notable dental hx.    Pulmonary neg COPD, former smoker   breath sounds clear to auscultation       Cardiovascular + CAD  (-) Past MI, (-) Cardiac Stents and (-) CABG + dysrhythmias  Rhythm:Regular Rate:Normal     Neuro/Psych neg Seizures PSYCHIATRIC DISORDERS Anxiety Depression     Neuromuscular disease (MG)    GI/Hepatic ,GERD  ,,(+) neg Cirrhosis        Endo/Other  Hypothyroidism    Renal/GU Renal disease     Musculoskeletal  (+) Arthritis , Osteoarthritis,    Abdominal   Peds  Hematology  (+) Blood dyscrasia, anemia   Anesthesia Other Findings   Reproductive/Obstetrics                              Anesthesia Physical Anesthesia Plan  ASA: 3  Anesthesia Plan: MAC and Spinal   Post-op Pain Management: Regional block*   Induction: Intravenous  PONV Risk Score and Plan: 3 and Ondansetron  and Propofol  infusion  Airway Management Planned: Natural Airway and Simple Face Mask  Additional Equipment:   Intra-op Plan:   Post-operative Plan:   Informed Consent: I have reviewed the patients History and Physical, chart, labs and discussed the procedure including the risks, benefits and alternatives for the proposed anesthesia with the patient or authorized representative who has indicated his/her understanding and acceptance.     Dental advisory given  Plan Discussed with: CRNA  Anesthesia Plan Comments: (See PAT note from 1/23)         Anesthesia Quick Evaluation  "

## 2025-01-12 ENCOUNTER — Other Ambulatory Visit: Payer: Self-pay

## 2025-01-12 ENCOUNTER — Encounter (HOSPITAL_COMMUNITY)
Admission: RE | Disposition: A | Discharge status: Home / Self Care | Payer: Self-pay | Source: Ambulatory Visit | Attending: Orthopedic Surgery

## 2025-01-12 ENCOUNTER — Ambulatory Visit (HOSPITAL_COMMUNITY)
Admission: RE | Admit: 2025-01-12 | Discharge: 2025-01-12 | Disposition: A | Discharge status: Home / Self Care | Source: Ambulatory Visit | Attending: Orthopedic Surgery | Admitting: Orthopedic Surgery

## 2025-01-12 ENCOUNTER — Ambulatory Visit (HOSPITAL_COMMUNITY): Payer: Self-pay | Admitting: Medical

## 2025-01-12 ENCOUNTER — Encounter (HOSPITAL_COMMUNITY): Payer: Self-pay | Admitting: Orthopedic Surgery

## 2025-01-12 ENCOUNTER — Ambulatory Visit (HOSPITAL_COMMUNITY): Admitting: Certified Registered"

## 2025-01-12 DIAGNOSIS — Z79899 Other long term (current) drug therapy: Secondary | ICD-10-CM | POA: Insufficient documentation

## 2025-01-12 DIAGNOSIS — E039 Hypothyroidism, unspecified: Secondary | ICD-10-CM | POA: Insufficient documentation

## 2025-01-12 DIAGNOSIS — Z539 Procedure and treatment not carried out, unspecified reason: Secondary | ICD-10-CM | POA: Insufficient documentation

## 2025-01-12 DIAGNOSIS — M179 Osteoarthritis of knee, unspecified: Secondary | ICD-10-CM | POA: Diagnosis present

## 2025-01-12 DIAGNOSIS — F419 Anxiety disorder, unspecified: Secondary | ICD-10-CM | POA: Insufficient documentation

## 2025-01-12 DIAGNOSIS — G7000 Myasthenia gravis without (acute) exacerbation: Secondary | ICD-10-CM | POA: Insufficient documentation

## 2025-01-12 DIAGNOSIS — F32A Depression, unspecified: Secondary | ICD-10-CM | POA: Insufficient documentation

## 2025-01-12 DIAGNOSIS — I2510 Atherosclerotic heart disease of native coronary artery without angina pectoris: Secondary | ICD-10-CM | POA: Insufficient documentation

## 2025-01-12 DIAGNOSIS — M1712 Unilateral primary osteoarthritis, left knee: Secondary | ICD-10-CM | POA: Insufficient documentation

## 2025-01-12 MED ORDER — DEXAMETHASONE SOD PHOSPHATE PF 10 MG/ML IJ SOLN
INTRAMUSCULAR | Status: AC
  Filled 2025-01-12: qty 1

## 2025-01-12 MED ORDER — DEXAMETHASONE SOD PHOSPHATE PF 10 MG/ML IJ SOLN: 8.0000 mg | Freq: Once | INTRAMUSCULAR | Status: DC

## 2025-01-12 MED ORDER — SODIUM CHLORIDE (PF) 0.9 % IJ SOLN
INTRAMUSCULAR | Status: AC
  Filled 2025-01-12: qty 10

## 2025-01-12 MED ORDER — POVIDONE-IODINE 10 % EX SWAB: 2.0000 | Freq: Once | CUTANEOUS | Status: DC

## 2025-01-12 MED ORDER — BUPIVACAINE LIPOSOME 1.3 % IJ SUSP
INTRAMUSCULAR | Status: AC
  Filled 2025-01-12: qty 20

## 2025-01-12 MED ORDER — CEFAZOLIN SODIUM-DEXTROSE 2-4 GM/100ML-% IV SOLN
2.0000 g | INTRAVENOUS | Status: DC
  Filled 2025-01-12: qty 100

## 2025-01-12 MED ORDER — ONDANSETRON HCL 4 MG/2ML IJ SOLN
INTRAMUSCULAR | Status: AC
  Filled 2025-01-12: qty 2

## 2025-01-12 MED ORDER — PROPOFOL 10 MG/ML IV BOLUS
INTRAVENOUS | Status: AC
  Filled 2025-01-12: qty 20

## 2025-01-12 MED ORDER — BUPIVACAINE LIPOSOME 1.3 % IJ SUSP: 20.0000 mL | Freq: Once | INTRAMUSCULAR | Status: DC

## 2025-01-12 MED ORDER — CHLORHEXIDINE GLUCONATE 0.12 % MT SOLN
15.0000 mL | Freq: Once | OROMUCOSAL | Status: AC
  Administered 2025-01-12: 15 mL via OROMUCOSAL

## 2025-01-12 MED ORDER — FENTANYL CITRATE (PF) 50 MCG/ML IJ SOSY
50.0000 ug | PREFILLED_SYRINGE | Freq: Once | INTRAMUSCULAR | Status: AC
  Administered 2025-01-12: 50 ug via INTRAVENOUS
  Filled 2025-01-12: qty 2

## 2025-01-12 MED ORDER — MIDAZOLAM HCL (PF) 2 MG/2ML IJ SOLN
1.0000 mg | Freq: Once | INTRAMUSCULAR | Status: AC
  Administered 2025-01-12: 1 mg via INTRAVENOUS
  Filled 2025-01-12: qty 2

## 2025-01-12 MED ORDER — CHLORHEXIDINE GLUCONATE 4 % EX SOLN: 1.0000 | CUTANEOUS | 1 refills | Status: AC

## 2025-01-12 MED ORDER — TRANEXAMIC ACID-NACL 1000-0.7 MG/100ML-% IV SOLN
1000.0000 mg | INTRAVENOUS | Status: DC
  Filled 2025-01-12: qty 100

## 2025-01-12 MED ORDER — LACTATED RINGERS IV SOLN: INTRAVENOUS | Status: DC

## 2025-01-12 MED ORDER — PHENYLEPHRINE 80 MCG/ML (10ML) SYRINGE FOR IV PUSH (FOR BLOOD PRESSURE SUPPORT)
PREFILLED_SYRINGE | INTRAVENOUS | Status: AC
  Filled 2025-01-12: qty 10

## 2025-01-12 MED ORDER — ORAL CARE MOUTH RINSE: 15.0000 mL | Freq: Once | OROMUCOSAL | Status: AC

## 2025-01-12 MED ORDER — SODIUM CHLORIDE (PF) 0.9 % IJ SOLN
INTRAMUSCULAR | Status: AC
  Filled 2025-01-12: qty 50

## 2025-01-12 MED ORDER — ACETAMINOPHEN 10 MG/ML IV SOLN
1000.0000 mg | Freq: Four times a day (QID) | INTRAVENOUS | Status: DC
  Filled 2025-01-12: qty 100

## 2025-01-12 MED ORDER — DEXAMETHASONE SOD PHOSPHATE PF 10 MG/ML IJ SOLN
INTRAMUSCULAR | Status: DC | PRN
  Administered 2025-01-12: 10 mg via PERINEURAL

## 2025-01-12 MED ORDER — PROPOFOL 1000 MG/100ML IV EMUL
INTRAVENOUS | Status: AC
  Filled 2025-01-12: qty 100

## 2025-01-12 MED ORDER — ROPIVACAINE HCL 5 MG/ML IJ SOLN
INTRAMUSCULAR | Status: DC | PRN
  Administered 2025-01-12: 15 mL via PERINEURAL

## 2025-01-12 MED ORDER — MUPIROCIN 2 % EX OINT: 1.0000 | TOPICAL_OINTMENT | Freq: Two times a day (BID) | CUTANEOUS | 0 refills | Status: AC

## 2025-01-12 NOTE — Interval H&P Note (Signed)
 History and Physical Interval Note:  01/12/2025 8:26 AM  Jean Jennings  has presented today for surgery, with the diagnosis of Left knee osteoarthritis.  The various methods of treatment have been discussed with the patient and family. After consideration of risks, benefits and other options for treatment, the patient has consented to  Procedures: ARTHROPLASTY, KNEE, TOTAL (Left) as a surgical intervention.  The patient's history has been reviewed, patient examined, no change in status, stable for surgery.  I have reviewed the patient's chart and labs.  Questions were answered to the patient's satisfaction.     Dempsey Tajee Savant

## 2025-01-12 NOTE — Transfer of Care (Deleted)
 Immediate Anesthesia Transfer of Care Note  Patient: Jean Jennings  Procedure(s) Performed: ARTHROPLASTY, KNEE, TOTAL (Left: Knee)  Patient Location: PACU  Anesthesia Type:Spinal  Level of Consciousness: drowsy and patient cooperative  Airway & Oxygen  Therapy: Patient Spontanous Breathing and Patient connected to face mask oxygen   Post-op Assessment: Report given to RN and Post -op Vital signs reviewed and stable  Post vital signs: Reviewed and stable  Last Vitals:  Vitals Value Taken Time  BP 153/57 01/12/25 10:45  Temp    Pulse 65 01/12/25 10:48  Resp 14 01/12/25 10:48  SpO2 99 % 01/12/25 10:48  Vitals shown include unfiled device data.  Last Pain:  Vitals:   01/12/25 0814  TempSrc: Oral  PainSc: 0-No pain         Complications: No notable events documented.

## 2025-01-12 NOTE — Anesthesia Procedure Notes (Signed)
 Anesthesia Regional Block: Adductor canal block   Pre-Anesthetic Checklist: , timeout performed,  Correct Patient, Correct Site, Correct Laterality,  Correct Procedure, Correct Position, site marked,  Risks and benefits discussed,  Surgical consent,  Pre-op evaluation,  At surgeon's request and post-op pain management  Laterality: Left  Prep: Maximum Sterile Barrier Precautions used, chloraprep       Needles:  Injection technique: Single-shot  Needle Type: Echogenic Needle      Needle Gauge: 20     Additional Needles:   Procedures:,,,, ultrasound used (permanent image in chart),,    Narrative:  Start time: 01/12/2025 10:35 AM End time: 01/12/2025 10:38 AM Injection made incrementally with aspirations every 5 mL.  Performed by: Personally  Anesthesiologist: Keneth Lynwood POUR, MD

## 2025-01-12 NOTE — Discharge Instructions (Signed)
 Jean Gross, MD [facility] [address], [address] [place], [place] [address] [phone]  TOTAL KNEE REPLACEMENT POSTOPERATIVE DIRECTIONS    Knee Rehabilitation, Guidelines Following Surgery  Results after knee surgery are often greatly improved when you follow the exercise, range of motion and muscle strengthening exercises prescribed by your doctor. Safety measures are also important to protect the knee from further injury. If any of these exercises cause you to have increased pain or swelling in your knee joint, decrease the amount until you are comfortable again and slowly increase them. If you have problems or questions, call your caregiver or physical therapist for advice.   BLOOD CLOT PREVENTION Take 81 mg Aspirin two times a day for three weeks following surgery. Then take an 81 mg Aspirin once a day for three weeks. Then discontinue Aspirin. You may resume your vitamins/supplements upon discharge from the hospital. Do not take any NSAIDs (Advil, Aleve, Ibuprofen, Meloxicam, etc.) for 3 weeks, while taking 81mg  Aspirin twice a day.   HOME CARE INSTRUCTIONS  Remove items at home which could result in a fall. This includes throw rugs or furniture in walking pathways.  ICE to the affected knee as much as tolerated. Icing helps control swelling. If the swelling is well controlled you will be more comfortable and rehab easier. Continue to use ice on the knee for pain and swelling from surgery. You may notice swelling that will progress down to the foot and ankle. This is normal after surgery. Elevate the leg when you are not up walking on it.    Continue to use the breathing machine which will help keep your temperature down. It is common for your temperature to cycle up and down following surgery, especially at night when you are not up moving around and exerting yourself. The breathing machine keeps your lungs expanded and your temperature  down. Do not place pillow under the operative knee, focus on keeping the knee straight while resting  DIET You may resume your previous home diet once you are discharged from the hospital.  DRESSING / WOUND CARE / SHOWERING Keep your bulky bandage on for 2 days. On the third post-operative day you may remove the Ace bandage and gauze. There is a waterproof adhesive bandage on your skin which will stay in place until your first follow-up appointment. Once you remove this you will not need to place another bandage You may begin showering 3 days following surgery, but do not submerge the incision under water.  ACTIVITY For the first 5 days, the key is rest and control of pain and swelling Do your home exercises twice a day starting on post-operative day 3. On the days you go to physical therapy, just do the home exercises once that day. You should rest, ice and elevate the leg for 50 minutes out of every hour. Get up and walk/stretch for 10 minutes per hour. After 5 days you can increase your activity slowly as tolerated. Walk with your walker as instructed. Use the walker until you are comfortable transitioning to a cane. Walk with the cane in the opposite hand of the operative leg. You may discontinue the cane once you are comfortable and walking steadily. Avoid periods of inactivity such as sitting longer than an hour when not asleep. This helps prevent blood clots.  You may discontinue the knee immobilizer once you are able to perform a straight leg raise while lying down. You may resume a sexual relationship in  one month or when given the OK by your doctor.  You may return to work once you are cleared by your doctor.  Do not drive a car for 6 weeks or until released by your surgeon.  Do not drive while taking narcotics.  TED HOSE STOCKINGS Wear the elastic stockings on both legs for three weeks following surgery during the day. You may remove them at night for sleeping.  WEIGHT  BEARING Weight bearing as tolerated with assist device (walker, cane, etc) as directed, use it as long as suggested by your surgeon or therapist, typically at least 4-6 weeks.  POSTOPERATIVE CONSTIPATION PROTOCOL Constipation - defined medically as fewer than three stools per week and severe constipation as less than one stool per week.  One of the most common issues patients have following surgery is constipation.  Even if you have a regular bowel pattern at home, your normal regimen is likely to be disrupted due to multiple reasons following surgery.  Combination of anesthesia, postoperative narcotics, change in appetite and fluid intake all can affect your bowels.  In order to avoid complications following surgery, here are some recommendations in order to help you during your recovery period.  Colace (docusate) - Pick up an over-the-counter form of Colace or another stool softener and take twice a day as long as you are requiring postoperative pain medications.  Take with a full glass of water daily.  If you experience loose stools or diarrhea, hold the colace until you stool forms back up. If your symptoms do not get better within 1 week or if they get worse, check with your doctor. Dulcolax (bisacodyl) - Pick up over-the-counter and take as directed by the product packaging as needed to assist with the movement of your bowels.  Take with a full glass of water.  Use this product as needed if not relieved by Colace only.  MiraLax (polyethylene glycol) - Pick up over-the-counter to have on hand. MiraLax is a solution that will increase the amount of water in your bowels to assist with bowel movements.  Take as directed and can mix with a glass of water, juice, soda, coffee, or tea. Take if you go more than two days without a movement. Do not use MiraLax more than once per day. Call your doctor if you are still constipated or irregular after using this medication for 7 days in a row.  If you continue  to have problems with postoperative constipation, please contact the office for further assistance and recommendations.  If you experience "the worst abdominal pain ever" or develop nausea or vomiting, please contact the office immediatly for further recommendations for treatment.  ITCHING If you experience itching with your medications, try taking only a single pain pill, or even half a pain pill at a time.  You can also use Benadryl over the counter for itching or also to help with sleep.   MEDICATIONS See your medication summary on the "After Visit Summary" that the nursing staff will review with you prior to discharge.  You may have some home medications which will be placed on hold until you complete the course of blood thinner medication.  It is important for you to complete the blood thinner medication as prescribed by your surgeon.  Continue your approved medications as instructed at time of discharge.  PRECAUTIONS If you experience chest pain or shortness of breath - call 911 immediately for transfer to the hospital emergency department.  If you develop a fever greater that  101 F, purulent drainage from wound, increased redness or drainage from wound, foul odor from the wound/dressing, or calf pain - CONTACT YOUR SURGEON.                                                   FOLLOW-UP APPOINTMENTS Make sure you keep all of your appointments after your operation with your surgeon and caregivers. You should call the office at the above phone number and make an appointment for approximately two weeks after the date of your surgery or on the date instructed by your surgeon outlined in the "After Visit Summary".  RANGE OF MOTION AND STRENGTHENING EXERCISES  Rehabilitation of the knee is important following a knee injury or an operation. After just a few days of immobilization, the muscles of the thigh which control the knee become weakened and shrink (atrophy). Knee exercises are designed to build up  the tone and strength of the thigh muscles and to improve knee motion. Often times heat used for twenty to thirty minutes before working out will loosen up your tissues and help with improving the range of motion but do not use heat for the first two weeks following surgery. These exercises can be done on a training (exercise) mat, on the floor, on a table or on a bed. Use what ever works the best and is most comfortable for you Knee exercises include:  Leg Lifts - While your knee is still immobilized in a splint or cast, you can do straight leg raises. Lift the leg to 60 degrees, hold for 3 sec, and slowly lower the leg. Repeat 10-20 times 2-3 times daily. Perform this exercise against resistance later as your knee gets better.  Quad and Hamstring Sets - Tighten up the muscle on the front of the thigh (Quad) and hold for 5-10 sec. Repeat this 10-20 times hourly. Hamstring sets are done by pushing the foot backward against an object and holding for 5-10 sec. Repeat as with quad sets.  Leg Slides: Lying on your back, slowly slide your foot toward your buttocks, bending your knee up off the floor (only go as far as is comfortable). Then slowly slide your foot back down until your leg is flat on the floor again. Angel Wings: Lying on your back spread your legs to the side as far apart as you can without causing discomfort.  A rehabilitation program following serious knee injuries can speed recovery and prevent re-injury in the future due to weakened muscles. Contact your doctor or a physical therapist for more information on knee rehabilitation.   POST-OPERATIVE OPIOID TAPER INSTRUCTIONS: It is important to wean off of your opioid medication as soon as possible. If you do not need pain medication after your surgery it is ok to stop day one. Opioids include: Codeine, Hydrocodone(Norco, Vicodin), Oxycodone(Percocet, oxycontin) and hydromorphone amongst others.  Long term and even short term use of opiods can  cause: Increased pain response Dependence Constipation Depression Respiratory depression And more.  Withdrawal symptoms can include Flu like symptoms Nausea, vomiting And more Techniques to manage these symptoms Hydrate well Eat regular healthy meals Stay active Use relaxation techniques(deep breathing, meditating, yoga) Do Not substitute Alcohol to help with tapering If you have been on opioids for less than two weeks and do not have pain than it is ok to stop all together.  Plan to wean off of opioids This plan should start within one week post op of your joint replacement. Maintain the same interval or time between taking each dose and first decrease the dose.  Cut the total daily intake of opioids by one tablet each day Next start to increase the time between doses. The last dose that should be eliminated is the evening dose.   IF YOU ARE TRANSFERRED TO A SKILLED REHAB FACILITY If the patient is transferred to a skilled rehab facility following release from the hospital, a list of the current medications will be sent to the facility for the patient to continue.  When discharged from the skilled rehab facility, please have the facility set up the patient's Home Health Physical Therapy prior to being released. Also, the skilled facility will be responsible for providing the patient with their medications at time of release from the facility to include their pain medication, the muscle relaxants, and their blood thinner medication. If the patient is still at the rehab facility at time of the two week follow up appointment, the skilled rehab facility will also need to assist the patient in arranging follow up appointment in our office and any transportation needs.  MAKE SURE YOU:  Understand these instructions.  Get help right away if you are not doing well or get worse.   DENTAL ANTIBIOTICS:  In most cases prophylactic antibiotics for Dental procdeures after total joint surgery are  not necessary.  Exceptions are as follows:  1. History of prior total joint infection  2. Severely immunocompromised (Organ Transplant, cancer chemotherapy, Rheumatoid biologic meds such as Humera)  3. Poorly controlled diabetes (A1C &gt; 8.0, blood glucose over 200)  If you have one of these conditions, contact your surgeon for an antibiotic prescription, prior to your dental procedure.    Pick up stool softner and laxative for home use following surgery while on pain medications. Do not submerge incision under water. Please use good hand washing techniques while changing dressing each day. May shower starting three days after surgery. Please use a clean towel to pat the incision dry following showers. Continue to use ice for pain and swelling after surgery. Do not use any lotions or creams on the incision until instructed by your surgeon.

## 2025-01-28 ENCOUNTER — Ambulatory Visit (HOSPITAL_COMMUNITY): Admit: 2025-01-28 | Admitting: Orthopedic Surgery
